# Patient Record
Sex: Female | Born: 1970 | Race: Black or African American | Hispanic: No | Marital: Single | Smoking: Never smoker
Health system: Southern US, Community
[De-identification: ages and names within clinical notes are randomized; demographics above are authoritative.]

## PROBLEM LIST (undated history)

## (undated) DIAGNOSIS — F329 Major depressive disorder, single episode, unspecified: Secondary | ICD-10-CM

## (undated) DIAGNOSIS — R519 Headache, unspecified: Secondary | ICD-10-CM

## (undated) DIAGNOSIS — R51 Headache: Secondary | ICD-10-CM

## (undated) DIAGNOSIS — R45851 Suicidal ideations: Secondary | ICD-10-CM

## (undated) DIAGNOSIS — T1491XA Suicide attempt, initial encounter: Secondary | ICD-10-CM

## (undated) DIAGNOSIS — I8390 Asymptomatic varicose veins of unspecified lower extremity: Secondary | ICD-10-CM

## (undated) DIAGNOSIS — F410 Panic disorder [episodic paroxysmal anxiety] without agoraphobia: Secondary | ICD-10-CM

## (undated) DIAGNOSIS — I10 Essential (primary) hypertension: Secondary | ICD-10-CM

## (undated) DIAGNOSIS — T7422XA Child sexual abuse, confirmed, initial encounter: Secondary | ICD-10-CM

## (undated) DIAGNOSIS — F32A Depression, unspecified: Secondary | ICD-10-CM

## (undated) DIAGNOSIS — T7840XA Allergy, unspecified, initial encounter: Secondary | ICD-10-CM

## (undated) DIAGNOSIS — F419 Anxiety disorder, unspecified: Secondary | ICD-10-CM

## (undated) HISTORY — DX: Suicidal ideations: R45.851

## (undated) HISTORY — PX: WISDOM TOOTH EXTRACTION: SHX21

## (undated) HISTORY — DX: Child sexual abuse, confirmed, initial encounter: T74.22XA

## (undated) HISTORY — DX: Allergy, unspecified, initial encounter: T78.40XA

## (undated) HISTORY — PX: COSMETIC SURGERY: SHX468

## (undated) HISTORY — DX: Anxiety disorder, unspecified: F41.9

## (undated) HISTORY — DX: Panic disorder (episodic paroxysmal anxiety): F41.0

---

## 1999-05-28 HISTORY — PX: TUBAL LIGATION: SHX77

## 1999-05-28 HISTORY — PX: REDUCTION MAMMAPLASTY: SUR839

## 2000-01-24 ENCOUNTER — Other Ambulatory Visit: Admission: RE | Admit: 2000-01-24 | Discharge: 2000-01-24 | Payer: Self-pay | Admitting: Plastic Surgery

## 2000-01-24 ENCOUNTER — Encounter (INDEPENDENT_AMBULATORY_CARE_PROVIDER_SITE_OTHER): Payer: Self-pay

## 2000-04-26 ENCOUNTER — Inpatient Hospital Stay (HOSPITAL_COMMUNITY): Admission: AD | Admit: 2000-04-26 | Discharge: 2000-04-26 | Payer: Self-pay | Admitting: Obstetrics

## 2000-09-30 ENCOUNTER — Encounter: Payer: Self-pay | Admitting: Emergency Medicine

## 2000-09-30 ENCOUNTER — Emergency Department (HOSPITAL_COMMUNITY): Admission: EM | Admit: 2000-09-30 | Discharge: 2000-09-30 | Payer: Self-pay | Admitting: Emergency Medicine

## 2001-08-13 ENCOUNTER — Other Ambulatory Visit: Admission: RE | Admit: 2001-08-13 | Discharge: 2001-08-13 | Payer: Self-pay | Admitting: *Deleted

## 2001-10-16 ENCOUNTER — Emergency Department (HOSPITAL_COMMUNITY): Admission: EM | Admit: 2001-10-16 | Discharge: 2001-10-16 | Payer: Self-pay | Admitting: Emergency Medicine

## 2001-12-22 ENCOUNTER — Ambulatory Visit (HOSPITAL_COMMUNITY): Admission: RE | Admit: 2001-12-22 | Discharge: 2001-12-22 | Payer: Self-pay | Admitting: *Deleted

## 2002-02-10 ENCOUNTER — Encounter: Payer: Self-pay | Admitting: Emergency Medicine

## 2002-02-10 ENCOUNTER — Emergency Department (HOSPITAL_COMMUNITY): Admission: EM | Admit: 2002-02-10 | Discharge: 2002-02-10 | Payer: Self-pay | Admitting: Emergency Medicine

## 2002-03-28 ENCOUNTER — Emergency Department (HOSPITAL_COMMUNITY): Admission: EM | Admit: 2002-03-28 | Discharge: 2002-03-28 | Payer: Self-pay | Admitting: Emergency Medicine

## 2003-09-29 ENCOUNTER — Emergency Department (HOSPITAL_COMMUNITY): Admission: EM | Admit: 2003-09-29 | Discharge: 2003-09-29 | Payer: Self-pay | Admitting: Emergency Medicine

## 2004-02-25 ENCOUNTER — Emergency Department (HOSPITAL_COMMUNITY): Admission: EM | Admit: 2004-02-25 | Discharge: 2004-02-25 | Payer: Self-pay | Admitting: Emergency Medicine

## 2004-08-07 ENCOUNTER — Emergency Department (HOSPITAL_COMMUNITY): Admission: EM | Admit: 2004-08-07 | Discharge: 2004-08-07 | Payer: Self-pay | Admitting: Family Medicine

## 2004-09-05 ENCOUNTER — Emergency Department (HOSPITAL_COMMUNITY): Admission: EM | Admit: 2004-09-05 | Discharge: 2004-09-05 | Payer: Self-pay | Admitting: Family Medicine

## 2005-03-15 ENCOUNTER — Emergency Department (HOSPITAL_COMMUNITY): Admission: EM | Admit: 2005-03-15 | Discharge: 2005-03-15 | Payer: Self-pay | Admitting: Emergency Medicine

## 2005-08-07 ENCOUNTER — Inpatient Hospital Stay (HOSPITAL_COMMUNITY): Admission: AD | Admit: 2005-08-07 | Discharge: 2005-08-07 | Payer: Self-pay | Admitting: Obstetrics

## 2006-10-02 ENCOUNTER — Emergency Department (HOSPITAL_COMMUNITY): Admission: EM | Admit: 2006-10-02 | Discharge: 2006-10-02 | Payer: Self-pay | Admitting: Emergency Medicine

## 2007-01-29 ENCOUNTER — Emergency Department (HOSPITAL_COMMUNITY): Admission: EM | Admit: 2007-01-29 | Discharge: 2007-01-29 | Payer: Self-pay | Admitting: Emergency Medicine

## 2007-12-08 ENCOUNTER — Emergency Department (HOSPITAL_COMMUNITY): Admission: EM | Admit: 2007-12-08 | Discharge: 2007-12-08 | Payer: Self-pay | Admitting: Family Medicine

## 2009-02-09 ENCOUNTER — Ambulatory Visit (HOSPITAL_COMMUNITY): Admission: RE | Admit: 2009-02-09 | Discharge: 2009-02-09 | Payer: Self-pay | Admitting: Obstetrics

## 2009-03-01 ENCOUNTER — Encounter (INDEPENDENT_AMBULATORY_CARE_PROVIDER_SITE_OTHER): Payer: Self-pay | Admitting: Obstetrics

## 2009-03-01 ENCOUNTER — Ambulatory Visit (HOSPITAL_COMMUNITY): Admission: RE | Admit: 2009-03-01 | Discharge: 2009-03-01 | Payer: Self-pay | Admitting: Obstetrics

## 2009-05-27 HISTORY — PX: DILATION AND CURETTAGE OF UTERUS: SHX78

## 2009-05-27 HISTORY — PX: ABDOMINOPLASTY: SUR9

## 2010-06-08 ENCOUNTER — Emergency Department (HOSPITAL_COMMUNITY)
Admission: EM | Admit: 2010-06-08 | Discharge: 2010-06-08 | Payer: Self-pay | Source: Home / Self Care | Admitting: Family Medicine

## 2010-08-30 LAB — CBC
HCT: 37.4 % (ref 36.0–46.0)
Hemoglobin: 12 g/dL (ref 12.0–15.0)
MCHC: 32.2 g/dL (ref 30.0–36.0)
MCV: 83.8 fL (ref 78.0–100.0)
Platelets: 317 10*3/uL (ref 150–400)
RBC: 4.46 MIL/uL (ref 3.87–5.11)
RDW: 14 % (ref 11.5–15.5)
WBC: 11.2 10*3/uL — ABNORMAL HIGH (ref 4.0–10.5)

## 2010-08-30 LAB — PREGNANCY, URINE: Preg Test, Ur: NEGATIVE

## 2011-07-25 ENCOUNTER — Emergency Department (HOSPITAL_COMMUNITY): Payer: Self-pay

## 2011-07-25 ENCOUNTER — Emergency Department (HOSPITAL_COMMUNITY)
Admission: EM | Admit: 2011-07-25 | Discharge: 2011-07-25 | Disposition: A | Discharge status: Home / Self Care | Payer: Self-pay | Attending: Emergency Medicine | Admitting: Emergency Medicine

## 2011-07-25 DIAGNOSIS — M25529 Pain in unspecified elbow: Secondary | ICD-10-CM | POA: Insufficient documentation

## 2011-07-25 DIAGNOSIS — W010XXA Fall on same level from slipping, tripping and stumbling without subsequent striking against object, initial encounter: Secondary | ICD-10-CM | POA: Insufficient documentation

## 2011-07-25 DIAGNOSIS — M25531 Pain in right wrist: Secondary | ICD-10-CM

## 2011-07-25 DIAGNOSIS — M25539 Pain in unspecified wrist: Secondary | ICD-10-CM | POA: Insufficient documentation

## 2011-07-25 MED ORDER — IBUPROFEN 800 MG PO TABS: 800.0000 mg | ORAL_TABLET | Freq: Three times a day (TID) | ORAL | Status: AC | PRN

## 2011-07-25 NOTE — Progress Notes (Signed)
Orthopedic Tech Progress Note Patient Details:  Jane Snyder 06/26/1970 [id number]  Other Ortho Devices Type of Ortho Device: Other (comment) (velcro wrist splint) Ortho Device Location: (R) UE Ortho Device Interventions: Application   Jennye Moccasin 07/25/2011, 5:22 PM

## 2011-07-25 NOTE — ED Notes (Addendum)
Pt here after slipping and catching self with her L wrist on sat. Swelling noted. Pt with a velcro splint she placed pta.swelling noted to wrist and fingers.

## 2011-07-25 NOTE — Discharge Instructions (Signed)
Wrist Pain Wrist injuries are frequent in adults and children. A sprain is an injury to the ligaments that hold your bones together. A strain is an injury to muscle or muscle cord-like structures (tendons) from stretching or pulling. Generally, when wrists are moderately tender to touch following a fall or injury, a break in the bone (fracture) may be present. Most wrist sprains or strains are better in 3 to 5 days, but complete healing may take several weeks. HOME CARE INSTRUCTIONS   Put ice on the injured area.   Put ice in a plastic bag.   Place a towel between your skin and the bag.   Leave the ice on for 15 to 20 minutes, 3 to 4 times a day, for the first 2 days.   Keep your arm raised above the level of your heart whenever possible to reduce swelling and pain.   Rest the injured area for at least 48 hours or as directed by your caregiver.   If a splint or elastic bandage has been applied, use it for as long as directed by your caregiver or until seen by a caregiver for a follow-up exam.   Only take over-the-counter or prescription medicines for pain, discomfort, or fever as directed by your caregiver.   Keep all follow-up appointments. You may need to follow up with a specialist or have follow-up X-rays. Improvement in pain level is not a guarantee that you did not fracture a bone in your wrist. The only way to determine whether or not you have a broken bone is by X-ray.  SEEK IMMEDIATE MEDICAL CARE IF:   Your fingers are swollen, very red, white, or cold and blue.   Your fingers are numb or tingling.   You have increasing pain.   You have difficulty moving your fingers.  MAKE SURE YOU:   Understand these instructions.   Will watch your condition.   Will get help right away if you are not doing well or get worse.  Document Released: 02/20/2005 Document Revised: 01/23/2011 Document Reviewed: 07/04/2010 [employer] Patient Information 2012 [employer]. 

## 2011-07-25 NOTE — ED Provider Notes (Signed)
History     CSN: [id number]  Arrival date & time 07/25/11  1443   First MD Initiated Contact with Patient 07/25/11 1601      Chief Complaint  Patient presents with  . Wrist Pain    (Consider location/radiation/quality/duration/timing/severity/associated sxs/prior treatment) HPI Comments: The patient presents with right wrist pain x5 days. The pain started when she was trying to get into a hot tub 5 days ago, slipped, and caught herself with her right hand. She is not sure which part of the hand endured the most impact. She has been experiencing progressive pain for the past 5 days that she reports as throbbing and sometimes radiates to her right elbow. 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 She has not tried anything for the pain. She denies numbness/tingling distal to the right wrist, weakness, color change of her right hand, or coolness of her right hand.   Patient is a 41 y.o. female presenting with wrist pain. The history is provided by the patient.  Wrist Pain Pertinent negatives include no fever, numbness or weakness.    No past medical history on file.  No past surgical history on file.  No family history on file.  History  Substance Use Topics  . Smoking status: Not on file  . Smokeless tobacco: Not on file  . Alcohol Use: Not on file    OB History    No data available      Review of Systems  Constitutional: Negative for fever.  Neurological: Negative for weakness and numbness.  All other systems reviewed and are negative.    Allergies  Review of patient's allergies indicates no known allergies.  Home Medications  No current outpatient prescriptions on file.  BP 150/75  Pulse 64  Temp 98.8 F (37.1 C)  Resp 16  SpO2 99%  LMP 07/01/2011  Physical Exam  Nursing note and vitals reviewed. Constitutional: She is oriented to person, place, and time. She appears well-developed and  well-nourished.  HENT:  Head: Normocephalic and atraumatic.  Neck: Neck supple.  Cardiovascular: Normal rate and regular rhythm.   Pulmonary/Chest: Effort normal and breath sounds normal.  Musculoskeletal:       Right elbow: Normal.She exhibits normal range of motion and no swelling. no tenderness found.       Right wrist: She exhibits decreased range of motion, tenderness and swelling. She exhibits no crepitus, no deformity and no laceration.       Right hand: Normal.       Right wrist tender to palpation diffusely. Pain exacerbated by supination.  Strength 5/5, sensation intact, fingers with full AROM, capillary refill < 2 seconds.  Radial pulse intact.      Neurological: She is alert and oriented to person, place, and time.    ED Course  Procedures (including critical care time)  Labs Reviewed - No data to display Dg Wrist Complete Right  07/25/2011  *RADIOLOGY REPORT*  Clinical Data: Fall with wrist pain.  RIGHT WRIST - COMPLETE 3+ VIEW  Comparison: 01/29/2007.  Findings: No acute osseous or joint abnormality.  IMPRESSION: No acute osseous or joint abnormality.  Original Report Authenticated By: Reyes Ivan, M.D.     1. Pain in right wrist       MDM  Patient with pain x 5 days after falling short distance onto wrist.  Xray is negative.  Pt placed in velcro splint in ED, given RICE instructions, PCP  follow up.  Patient verbalizes understanding and agrees with plan.          Dillard Cannon [place], [place] 07/25/11 2132

## 2011-07-25 NOTE — ED Notes (Signed)
Was on cruise and hurt rt wrist will not stop hurting

## 2011-07-25 NOTE — ED Provider Notes (Signed)
Medical screening examination/treatment/procedure(s) were performed by non-physician practitioner and as supervising physician I was immediately available for consultation/collaboration.   Glynn Octave, MD 07/25/11 2156

## 2011-08-26 ENCOUNTER — Encounter (HOSPITAL_COMMUNITY): Payer: Self-pay | Admitting: *Deleted

## 2011-08-26 ENCOUNTER — Emergency Department (HOSPITAL_COMMUNITY)
Admission: EM | Admit: 2011-08-26 | Discharge: 2011-08-27 | Disposition: A | Discharge status: Home / Self Care | Payer: Self-pay | Attending: Emergency Medicine | Admitting: Emergency Medicine

## 2011-08-26 DIAGNOSIS — R000 Tachycardia, unspecified: Secondary | ICD-10-CM | POA: Insufficient documentation

## 2011-08-26 DIAGNOSIS — R0682 Tachypnea, not elsewhere classified: Secondary | ICD-10-CM | POA: Insufficient documentation

## 2011-08-26 DIAGNOSIS — F418 Other specified anxiety disorders: Secondary | ICD-10-CM

## 2011-08-26 DIAGNOSIS — J45909 Unspecified asthma, uncomplicated: Secondary | ICD-10-CM | POA: Insufficient documentation

## 2011-08-26 DIAGNOSIS — F411 Generalized anxiety disorder: Secondary | ICD-10-CM | POA: Insufficient documentation

## 2011-08-26 MED ORDER — LORAZEPAM 1 MG PO TABS
1.0000 mg | ORAL_TABLET | Freq: Once | ORAL | Status: AC
  Administered 2011-08-26: 1 mg via ORAL
  Filled 2011-08-26: qty 1

## 2011-08-26 MED ORDER — LORAZEPAM 1 MG PO TABS: 0.5000 mg | ORAL_TABLET | Freq: Four times a day (QID) | ORAL | Status: AC | PRN

## 2011-08-26 NOTE — ED Provider Notes (Signed)
Medical screening examination/treatment/procedure(s) were performed by non-physician practitioner and as supervising physician I was immediately available for consultation/collaboration.   Celene Kras, MD 08/26/11 [phone]

## 2011-08-26 NOTE — ED Notes (Signed)
Per EMS:  Tonight pt was given bad news and started having a panic attack and pt became SOB and used her inhaler.  Pt's family called EMS because pt was having a panic attack.  Upon EMS' arrival pt did not appear to be in any distress.  Pt was given 5mg  of albuterol in route, pt is now clear and equal bilaterally and in no acute distress.

## 2011-08-26 NOTE — Discharge Instructions (Signed)
RESOURCE GUIDE  Dental Problems  Patients with Medicaid: [facility]                     [facility] [address]                                           [address] Phone:  [phone]                                                  Phone:  [phone]  If unable to pay or uninsured, contact:  [facility] or [facility] to become qualified for the adult dental clinic.  Chronic Pain Problems Contact [facility]  [phone] Patients need to be referred by their primary care doctor.  Insufficient Money for Medicine Contact [employer]:  call "211" or [facility] 271-5999.  No Primary Care Doctor Call [facility]  832-8000 Other agencies that provide inexpensive medical care    [facility]  [phone]    [facility]  [phone]    [facility]  [phone]    Women's Clinic  [phone]    [facility]  [phone]    [facility]  [phone]  Psychological Services [facility]  [phone] [facility]  [phone] [facility]   800 853-5163 (emergency services 641-4993)  Substance Abuse Resources [facility]  336-882-2125 [facility] 336-784-9470 [facility] 336-285-9073 [facility] 336-845-3988 Residential & Outpatient Substance Abuse Program  800-659-3381  Abuse/Neglect [place] Child Abuse Hotline (336) 641-3795 [place] Child Abuse Hotline 800-378-5315 (After Hours)  Emergency Shelter [facility] (336) 271-5985  Maternity Homes [facility] (336) 275-9566 [facility] (704) 372-4663  MRSA Hotline #:   832-7006    [place] Resources  [facility] [place]     [employer]                          [facility] [address] [place]                       [address]      [address]  [place]                                                [place]                            [place] Phone:  [phone]                                   Phone:  [phone]                 Phone:  [phone]  [facility] Phone:  [phone]    [facility] Child Abuse Hotline (248) 879-5723 (903)480-9057 (After Hours) Anxiety and Panic Attacks Anxiety is your body's way of reacting to real danger or something you think is a danger. It may be fear or worry over a situation like losing your job. Sometimes the cause is not known. A panic attack is made up of physical signs like sweating, shaking, or chest pain. Anxiety and panic attacks may start suddenly. They may be strong. They may come at any time of day, even while sleeping. They may come at any time of life. Panic attacks are scary, but they do not harm you physically.  HOME CARE  Avoid any known causes of your anxiety.   Try to relax. Yoga may help. Tell yourself everything will be okay.   Exercise often.   Get expert advice and help (therapy) to stop anxiety or attacks from happening.   Avoid caffeine, alcohol, and drugs.   Only take medicine as told by your doctor.  GET HELP RIGHT AWAY IF:  Your attacks seem different than normal attacks.   Your problems are getting worse or concern you.  MAKE SURE YOU:  Understand these instructions.   Will watch your condition.   Will get help right away if you are not doing well or get worse.  Document Released: 06/15/2010 Document Revised: 05/02/2011 Document Reviewed: 06/15/2010 [facility] Patient Information 2012 [place], [place]. Please make an appointment with the counsel of your choice for evaluation and, discussion.  You've been given a short course of Ativan to help bridge the gap between now and therapy

## 2011-08-26 NOTE — ED Notes (Signed)
Pt st's "I was given some disturbing news about one of my children."  Pt had panic attack at home per family and is very upset.  St's she was having some trouble breathing but EMS' gave 5mg  of albuterol in route and now pt is clear and equal bilaterally.

## 2011-08-26 NOTE — ED Provider Notes (Signed)
History     CSN: [id number]  Arrival date & time 08/26/11  2124   First MD Initiated Contact with Patient 08/26/11 2144      Chief Complaint  Patient presents with  . Anxiety    (Consider location/radiation/quality/duration/timing/severity/associated sxs/prior treatment) HPI Comments: Patient found out today that her 39 year old daughter, who is a Holiday representative in high school is pregnant.  This set off her asthma and an anxiety attack.  Patient does not have a history of anxiety attacks.  She came to the emergency department via EMS, crying, unable to catch her breath, tachycardic and tachypneic.  She has family at the bedside  Patient is a 41 y.o. female presenting with anxiety. The history is provided by the patient and a relative.  Anxiety This is a new problem. The current episode started today. The problem occurs constantly. Pertinent negatives include no chills, congestion, fever, myalgias, nausea, numbness, vomiting or weakness. The symptoms are aggravated by stress. She has tried nothing for the symptoms.    Past Medical History  Diagnosis Date  . Asthma     History reviewed. No pertinent past surgical history.  No family history on file.  History  Substance Use Topics  . Smoking status: Not on file  . Smokeless tobacco: Not on file  . Alcohol Use:     OB History    Grav Para Term Preterm Abortions TAB SAB Ect Mult Living                  Review of Systems  Constitutional: Negative for fever and chills.  HENT: Negative for congestion.   Gastrointestinal: Negative for nausea and vomiting.  Musculoskeletal: Negative for myalgias.  Neurological: Negative for dizziness, weakness and numbness.  Psychiatric/Behavioral: Positive for agitation.    Allergies  Review of patient's allergies indicates no known allergies.  Home Medications   Current Outpatient Rx  Name Route Sig Dispense Refill  . ALBUTEROL SULFATE HFA 108 (90 BASE) MCG/ACT IN AERS Inhalation Inhale 2  puffs into the lungs every 6 (six) hours as needed.      BP 168/100  Pulse 82  Temp(Src) 98.8 F (37.1 C) (Oral)  Resp 24  SpO2 100%  Physical Exam  Constitutional: She is oriented to person, place, and time. She appears well-developed and well-nourished.  HENT:  Head: Normocephalic.  Neck: Normal range of motion.  Cardiovascular: Normal rate.   Pulmonary/Chest: Effort normal.  Musculoskeletal: Normal range of motion.  Neurological: She is alert and oriented to person, place, and time.  Skin: Skin is warm.    ED Course  Procedures (including critical care time)  Labs Reviewed - No data to display No results found.   1. Situational anxiety     Patient received by mouth Ativan.  She was able to calm herself to the point where she can discuss her situation.  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  Her mother and sister at the bedside.  She feels safe going home.  I have offered a short course of Ativan to help her with this acute situational anxiety  MDM  Situational anxiety        Arman Filter, NP 08/26/11 2319  Arman Filter, NP 08/26/11 [phone]

## 2012-08-18 ENCOUNTER — Encounter (HOSPITAL_COMMUNITY): Payer: Self-pay | Admitting: *Deleted

## 2012-08-18 ENCOUNTER — Inpatient Hospital Stay (HOSPITAL_COMMUNITY)
Admission: AD | Admit: 2012-08-18 | Discharge: 2012-08-24 | DRG: 885 | Disposition: A | Discharge status: Home / Self Care | Payer: Federal, State, Local not specified - Other | Source: Intra-hospital | Attending: Psychiatry | Admitting: Psychiatry

## 2012-08-18 ENCOUNTER — Emergency Department (HOSPITAL_COMMUNITY)
Admission: EM | Admit: 2012-08-18 | Discharge: 2012-08-18 | Disposition: A | Discharge status: Other Acute Inpatient Hospital | Payer: Self-pay | Attending: Emergency Medicine | Admitting: Emergency Medicine

## 2012-08-18 DIAGNOSIS — R51 Headache: Secondary | ICD-10-CM

## 2012-08-18 DIAGNOSIS — Z3202 Encounter for pregnancy test, result negative: Secondary | ICD-10-CM | POA: Insufficient documentation

## 2012-08-18 DIAGNOSIS — F411 Generalized anxiety disorder: Secondary | ICD-10-CM | POA: Diagnosis present

## 2012-08-18 DIAGNOSIS — R45851 Suicidal ideations: Secondary | ICD-10-CM

## 2012-08-18 DIAGNOSIS — J45909 Unspecified asthma, uncomplicated: Secondary | ICD-10-CM | POA: Insufficient documentation

## 2012-08-18 DIAGNOSIS — F329 Major depressive disorder, single episode, unspecified: Secondary | ICD-10-CM

## 2012-08-18 DIAGNOSIS — G43909 Migraine, unspecified, not intractable, without status migrainosus: Secondary | ICD-10-CM | POA: Diagnosis present

## 2012-08-18 DIAGNOSIS — Z79899 Other long term (current) drug therapy: Secondary | ICD-10-CM | POA: Insufficient documentation

## 2012-08-18 DIAGNOSIS — F332 Major depressive disorder, recurrent severe without psychotic features: Principal | ICD-10-CM | POA: Diagnosis present

## 2012-08-18 HISTORY — DX: Major depressive disorder, single episode, unspecified: F32.9

## 2012-08-18 HISTORY — DX: Suicide attempt, initial encounter: T14.91XA

## 2012-08-18 HISTORY — DX: Depression, unspecified: F32.A

## 2012-08-18 LAB — COMPREHENSIVE METABOLIC PANEL
AST: 13 U/L (ref 0–37)
Albumin: 3.1 g/dL — ABNORMAL LOW (ref 3.5–5.2)
Alkaline Phosphatase: 86 U/L (ref 39–117)
Chloride: 100 mEq/L (ref 96–112)
Creatinine, Ser: 0.75 mg/dL (ref 0.50–1.10)
Potassium: 3.7 mEq/L (ref 3.5–5.1)
Total Bilirubin: 0.3 mg/dL (ref 0.3–1.2)

## 2012-08-18 LAB — CBC
Hemoglobin: 11.9 g/dL — ABNORMAL LOW (ref 12.0–15.0)
MCH: 27 pg (ref 26.0–34.0)
MCHC: 26.4 g/dL — ABNORMAL LOW (ref 30.0–36.0)
MCV: 102 fL — ABNORMAL HIGH (ref 78.0–100.0)
Platelets: 274 10*3/uL (ref 150–400)
RDW: 14.9 % (ref 11.5–15.5)
WBC: 10.2 10*3/uL (ref 4.0–10.5)

## 2012-08-18 LAB — POCT PREGNANCY, URINE: Preg Test, Ur: NEGATIVE

## 2012-08-18 LAB — RAPID URINE DRUG SCREEN, HOSP PERFORMED
Amphetamines: NOT DETECTED
Opiates: NOT DETECTED
Tetrahydrocannabinol: NOT DETECTED

## 2012-08-18 MED ORDER — TRAZODONE HCL 50 MG PO TABS
50.0000 mg | ORAL_TABLET | Freq: Every day | ORAL | Status: DC
  Administered 2012-08-18: 50 mg via ORAL
  Filled 2012-08-18 (×4): qty 1

## 2012-08-18 MED ORDER — AMLODIPINE BESYLATE 5 MG PO TABS: 5.0000 mg | ORAL_TABLET | Freq: Every day | ORAL | Status: DC

## 2012-08-18 MED ORDER — AMLODIPINE BESYLATE 5 MG PO TABS
5.0000 mg | ORAL_TABLET | Freq: Every day | ORAL | Status: AC
  Administered 2012-08-18: 5 mg via ORAL
  Filled 2012-08-18: qty 1

## 2012-08-18 MED ORDER — ALUM & MAG HYDROXIDE-SIMETH 200-200-20 MG/5ML PO SUSP
30.0000 mL | ORAL | Status: DC | PRN
  Administered 2012-08-23: 30 mL via ORAL

## 2012-08-18 MED ORDER — MAGNESIUM HYDROXIDE 400 MG/5ML PO SUSP: 30.0000 mL | Freq: Every day | ORAL | Status: DC | PRN

## 2012-08-18 MED ORDER — ACETAMINOPHEN 325 MG PO TABS: 650.0000 mg | ORAL_TABLET | Freq: Four times a day (QID) | ORAL | Status: DC | PRN

## 2012-08-18 MED ORDER — ALBUTEROL SULFATE HFA 108 (90 BASE) MCG/ACT IN AERS: 2.0000 | INHALATION_SPRAY | Freq: Four times a day (QID) | RESPIRATORY_TRACT | Status: DC | PRN

## 2012-08-18 MED ORDER — LISINOPRIL 10 MG PO TABS
10.0000 mg | ORAL_TABLET | Freq: Once | ORAL | Status: AC
  Administered 2012-08-18: 10 mg via ORAL
  Filled 2012-08-18 (×2): qty 1

## 2012-08-18 MED ORDER — AMLODIPINE BESYLATE 5 MG PO TABS
5.0000 mg | ORAL_TABLET | Freq: Every day | ORAL | Status: DC
  Administered 2012-08-19 – 2012-08-24 (×6): 5 mg via ORAL
  Filled 2012-08-18 (×9): qty 1

## 2012-08-18 MED ORDER — MAGNESIUM HYDROXIDE 400 MG/5ML PO SUSP
30.0000 mL | Freq: Every day | ORAL | Status: DC | PRN
  Administered 2012-08-21: 30 mL via ORAL

## 2012-08-18 MED ORDER — ACETAMINOPHEN 325 MG PO TABS
650.0000 mg | ORAL_TABLET | Freq: Four times a day (QID) | ORAL | Status: DC | PRN
  Administered 2012-08-18 – 2012-08-19 (×3): 650 mg via ORAL

## 2012-08-18 MED ORDER — ALUM & MAG HYDROXIDE-SIMETH 200-200-20 MG/5ML PO SUSP: 30.0000 mL | ORAL | Status: DC | PRN

## 2012-08-18 NOTE — Tx Team (Signed)
Initial Interdisciplinary Treatment Plan  PATIENT STRENGTHS: (choose at least two) Ability for insight Average or above average intelligence Capable of independent living Physical Health  PATIENT STRESSORS: Financial difficulties   PROBLEM LIST: Problem List/Patient Goals Date to be addressed Date deferred Reason deferred Estimated date of resolution  Suicidal thoughts 08/18/2012     Depression 08/18/2012                                                DISCHARGE CRITERIA:  Ability to meet basic life and health needs Need for constant or close observation no longer present  PRELIMINARY DISCHARGE PLAN: Return to previous work or school arrangements  PATIENT/FAMIILY INVOLVEMENT: This treatment plan has been presented to and reviewed with the patient, Jane Snyder.  The patient  has been given the opportunity to ask questions and make suggestions.  Leighton Parody M 08/18/2012, 4:17 PM

## 2012-08-18 NOTE — ED Provider Notes (Signed)
Medical screening examination/treatment/procedure(s) were performed by non-physician practitioner and as supervising physician I was immediately available for consultation/collaboration.  Kelsi Benham, MD 08/18/12 1556 

## 2012-08-18 NOTE — ED Notes (Signed)
Pt now being transported to [facility].

## 2012-08-18 NOTE — ED Notes (Signed)
Pt brought from [facility] accompanied by [employer] with IVC papers for medical clearance. Pt reports SI with a plan of driving car into a tree or off a bridge. Pt denies HI or AV hallucinations. PA at bedside at 1209.

## 2012-08-18 NOTE — ED Notes (Signed)
Security in to wand patient and search pt belongings. 

## 2012-08-18 NOTE — Progress Notes (Signed)
Pt reports she was admitted today after being petitioned by her sister.  She recently lost her job and is the single mother of three teenagers.  She was having suicidal thoughts, but can contract for safety here.  She says she feels safe here.  She was observed in her room sitting on the bed, but plans to go to evening group tonight.  Her BP was elevated earlier, but she was given a one time dose of Lisinopril which brought it down some.  Encouraged pt to make her needs known to staff.  Support/encouragement given.  Safety maintained with q15 minute checks.

## 2012-08-18 NOTE — ED Notes (Signed)
Awaiting MD to complete EMTALA form, will monitor.

## 2012-08-18 NOTE — Treatment Plan (Signed)
Pt accepted to [facility] by Nanine Means, NP to room 303-1.

## 2012-08-18 NOTE — ED Notes (Signed)
Called [facility], spoke with Minerva Areola and was told that pt does have a bed pending medical clearance, PA also informed, will monitor.
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
## 2012-08-18 NOTE — ED Notes (Signed)
Tanna Savoy at [facility] called by Minerva Areola, RN in Psych ED and was told that pt does have a bed, 303, bed 1, PA aware, will monitor.

## 2012-08-18 NOTE — ED Notes (Signed)
Pt transferred to [facility] accompanied by [employer] with IVC, chart and personal belongings. Condition stable at time of discharge.

## 2012-08-18 NOTE — ED Notes (Signed)
Spoke with Minerva Areola at [facility] again after pt medically cleared per PA at [facility] ED, this nurse informed that pt does not have a bed, Josh, PA informed for holding orders for Psych ED.

## 2012-08-18 NOTE — ED Provider Notes (Signed)
History     CSN: [id number]  Arrival date & time 08/18/12  1134   First MD Initiated Contact with Patient 08/18/12 1033      Chief Complaint  Patient presents with  . Medical Clearance    (Consider location/radiation/quality/duration/timing/severity/associated sxs/prior treatment) HPI Comments: Patient with history of depression presents under involuntary commitment for suicidal ideation. Patient states that she has multiple stressors at home. She has a past history of suicide attempt by overdose. She denies drug or alcohol use. She denies any other current medical complaints. Onset of symptoms insidious. Course is constant. Nothing makes symptoms better/worse.  The history is provided by the patient and medical records.    Past Medical History  Diagnosis Date  . Asthma     Past Surgical History  Procedure Laterality Date  . Breast surgery    . Tubal ligation    . Cosmetic surgery      Abdomen  . Wisdom tooth extraction      History reviewed. No pertinent family history.  History  Substance Use Topics  . Smoking status: Not on file  . Smokeless tobacco: Not on file  . Alcohol Use:     OB History   Grav Para Term Preterm Abortions TAB SAB Ect Mult Living                  Review of Systems  Constitutional: Negative for fever.  HENT: Negative for sore throat and rhinorrhea.   Eyes: Negative for redness.  Respiratory: Negative for cough.   Cardiovascular: Negative for chest pain.  Gastrointestinal: Negative for nausea, vomiting, abdominal pain and diarrhea.  Genitourinary: Negative for dysuria.  Musculoskeletal: Negative for myalgias.  Skin: Negative for rash.  Neurological: Negative for headaches.  Psychiatric/Behavioral: Positive for suicidal ideas. Negative for self-injury.    Allergies  Review of patient's allergies indicates no known allergies.  Home Medications   Current Outpatient Rx  Name  Route  Sig  Dispense  Refill  . albuterol (PROVENTIL  HFA;VENTOLIN HFA) 108 (90 BASE) MCG/ACT inhaler   Inhalation   Inhale 2 puffs into the lungs every 6 (six) hours as needed.           BP 181/109  Pulse 70  Temp(Src) 98.6 F (37 C) (Oral)  Resp 17  Wt 203 lb (92.08 kg)  SpO2 99%  Physical Exam  Nursing note and vitals reviewed. Constitutional: She appears well-developed and well-nourished.  HENT:  Head: Normocephalic and atraumatic.  Eyes: Conjunctivae are normal. Right eye exhibits no discharge. Left eye exhibits no discharge.  Neck: Normal range of motion. Neck supple.  Cardiovascular: Normal rate, regular rhythm and normal heart sounds.   Pulmonary/Chest: Effort normal and breath sounds normal.  Abdominal: Soft. There is no tenderness.  Neurological: She is alert.  Skin: Skin is warm and dry.  Psychiatric: She exhibits a depressed mood. She expresses suicidal ideation. She expresses no homicidal ideation.    ED Course  Procedures (including critical care time)  Labs Reviewed  CBC - Abnormal; Notable for the following:    Hemoglobin 11.9 (*)    MCV 102.0 (*)    MCHC 26.4 (*)    All other components within normal limits  COMPREHENSIVE METABOLIC PANEL - Abnormal; Notable for the following:    Sodium 134 (*)    Albumin 3.1 (*)    All other components within normal limits  ETHANOL  URINE RAPID DRUG SCREEN (HOSP PERFORMED)  POCT PREGNANCY, URINE   No results found.  1. Suicidal ideation     12:12 PM Patient seen and examined. Work-up initiated. Medications ordered.   Vital signs reviewed and are as follows: Filed Vitals:   08/18/12 1135  BP: 181/109  Pulse: 70  Temp: 98.6 F (37 C)  Resp: 17   3:14 PM Patient was medically cleared. Blood pressure improved without treatment. Patient will likely need this followed as an outpatient. No evidence of endorgan damage today.  Patient has a bed awaiting her at [facility]. She was transferred to [facility].   MDM  Patient with SI under IVC. Transferred to behavioral  health. She is medically cleared.        Renne Crigler, PA-C 08/18/12 1515

## 2012-08-19 MED ORDER — IBUPROFEN 600 MG PO TABS
600.0000 mg | ORAL_TABLET | Freq: Four times a day (QID) | ORAL | Status: DC | PRN
  Administered 2012-08-19 – 2012-08-23 (×6): 600 mg via ORAL
  Filled 2012-08-19 (×6): qty 1

## 2012-08-19 MED ORDER — TRAZODONE HCL 100 MG PO TABS
100.0000 mg | ORAL_TABLET | Freq: Every evening | ORAL | Status: DC | PRN
  Filled 2012-08-19 (×3): qty 1

## 2012-08-19 MED ORDER — SERTRALINE HCL 25 MG PO TABS
25.0000 mg | ORAL_TABLET | Freq: Every day | ORAL | Status: DC
  Administered 2012-08-19 – 2012-08-21 (×3): 25 mg via ORAL
  Filled 2012-08-19 (×5): qty 1

## 2012-08-19 MED ORDER — HYDROXYZINE HCL 50 MG PO TABS
50.0000 mg | ORAL_TABLET | Freq: Every evening | ORAL | Status: DC | PRN
  Administered 2012-08-19 – 2012-08-20 (×2): 50 mg via ORAL
  Filled 2012-08-19 (×8): qty 1
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
## 2012-08-19 NOTE — Progress Notes (Signed)
[facility] LCSW Group Therapy  Emotional Regulation  08/19/2012 3:54 PM  Type of Therapy:  Group Therapy  Participation Level:  Did Not Attend  Wynn Banker 08/19/2012, 3:54 PM

## 2012-08-19 NOTE — Progress Notes (Addendum)
D: Patient appeared sad and depressed at the beginning of the shift. She endorsed a rough day, she stated that her head hurts all day and asked if someone could  check her blood pressure; her B/P was slightly elevated in the morning. Patient denied SI/HI and denied  Hallucinations. She also said she didn't sleep good yesterday.  A: Writer consulted with the PA about either increasing the B/P medication, prescrinbing something steronger that Tylenol 650 mg for headache or increasing the dosage of  her Trazodone to help her sleep better. PA agreed to add NSAID for the headache and increase the Trazodone. MHT to check patient's blood pressure tonight. R: Patient receptive to support and encouragement. Q 15 minute check continues as scheduled to maintain safety.  [id number] : Patient reported feeling a lot better this am, she endorsed sleeping well last night, denied headache and her blood pressure improved 128/81.

## 2012-08-19 NOTE — Progress Notes (Signed)
Recreation Therapy Notes  Date: 03.26.2014 Time: 3:00pm Location: [facility]      Group Topic/Focus: Goal Setting  Participation Level: Active  Participation Quality: Appropriate  Affect: Flat  Cognitive: Appropriate  Additional Comments: Patient with peer play "Joined at the Hip" a game that required patients hold a beach ball between their hips and set a goal for how far they think they can walk without the ball dropping to the floor. Patient with peer set goal and reached goal. Patient with peer set goal for how far they could walk holding the beach ball with one finger. Patient with peer reached goal. Patient offered three suggestions to peer for reaching personal goal. Patient actively participated in group activity.    Marykay Lex Tamsin Nader, LRT/CTRS  Rozella Servello L 08/19/2012 4:07 PM

## 2012-08-19 NOTE — Tx Team (Signed)
Interdisciplinary Treatment Plan Update   Date Reviewed:  08/19/2012  Time Reviewed:  10:06 AM  Progress in Treatment:   Attending groups: Yes Participating in groups: Yes Taking medication as prescribed: Yes  Tolerating medication: Yes Family/Significant other contact made: Patient to be asked for consent to contact family  Patient understands diagnosis: Yes  Discussing patient identified problems/goals with staff: Yes Medical problems stabilized or resolved: Yes Denies suicidal/homicidal ideation: Yes Patient has not harmed self or others: Yes  For review of initial/current patient goals, please see plan of care.  Estimated Length of Stay:  3-5 days  Reasons for Continued Hospitalization:  Anxiety Depression Medication stabilization Suicidal ideation  New Problems/Goals identified:    Discharge Plan or Barriers:   Home with outpatient follow up to be scheduled  Additional Comments:  Patient reports admitting to hospital with SI with plan to drive car into something or off a bridge. She currently denies SI/HI.  She rates depression at nine/ten and anxiety and all other symptoms at eight.    Attendees:  Patient: Jane Snyder 08/19/2012 10:06 AM   Signature: Patrick North, MD 08/19/2012 10:06 AM  Signature:Tina Arlana Pouch, RN 08/19/2012 10:06 AM  Signature: Harold Barban, RN 08/19/2012 10:06 AM  Signature: 08/19/2012 10:06 AM  Signature:   08/19/2012 10:06 AM  Signature:  Juline Patch, LCSW 08/19/2012 10:06 AM  Signature:  08/19/2012 10:06 AM  Signature:  08/19/2012 10:06 AM  Signature: Fransisca Kaufmann, [facility] 08/19/2012 10:06 AM  Signature:    Signature:    Signature:      Scribe for Treatment Team:   Juline Patch,  08/19/2012 10:06 AM
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
## 2012-08-19 NOTE — Progress Notes (Signed)
[facility] LCSW Aftercare Discharge Planning Group Note  08/19/2012 10:21 AM  Participation Quality:  Appropriate and Attentive  Affect:  Appropriate, Blunted, Depressed and Flat  Cognitive:  Alert and Appropriate  Insight:  Engaged  Engagement in Group:  Engaged  Modes of Intervention:  Education, Exploration, Problem-solving, Rapport Building and Support  Summary of Progress/Problems: Patient advised of admitting to hospital with SI.  She currently denies SI/HI.  She rates depression at nine/ten and anxiety and other symptoms at eight.  She reports having home and transportation. She will need assistance with outpatient follow up.  Wynn Banker 08/19/2012, 10:21 AM

## 2012-08-20 NOTE — Progress Notes (Signed)
D: Patient is having passive SI but verbally agrees to contract for safety. Patient denies any HI or auditory and visual hallucinations. The patient has a depressed mood and affect. The patient reports sleeping fairly well and states that her appetite and energy levels are normal. The patient is interacting appropriately within the milieu and is attending some groups.  A: Patient given emotional support from RN. Patient encouraged to come to staff with concerns and/or questions. Patient's medication routine continued. Patient's orders and plan of care reviewed.  R: Patient remains cooperative. Will continue to monitor patient q15 minutes for safety.

## 2012-08-20 NOTE — Progress Notes (Signed)
[facility] LCSW Aftercare Discharge Planning Group Note  08/20/2012 2:28 PM  Participation Quality:  Appropriate and Attentive  Affect:  Appropriate  Cognitive:  Appropriate  Insight:  Engaged  Engagement in Group:  Engaged  Modes of Intervention:  Education, Exploration, Dentist, Rapport Building and Support  Summary of Progress/Problems:  Patient reports doing better today and denies SI/HI.  She rates depression at five and anxiety at four.  Patient shared being here has given her time to think and problems are not as overwhelming today.  Wynn Banker 08/20/2012, 2:28 PM

## 2012-08-20 NOTE — Progress Notes (Signed)
[facility] LCSW Group Therapy              [facility] 1:15 - 2:30 PM   08/20/2012 2:30 PM  Type of Therapy:  Group Therapy  Participation Level: Limited  Participation Quality:  Appropriate and Attentive  Affect:  Appropriate  Cognitive:  Appropriate  Insight:  Engaged  Engagement in Therapy:  Engaged  Modes of Intervention:  Discussion, Education, Exploration, Problem-solving, Rapport Building and Support  Summary of Progress/Problems: Patient listened attentively to speaker from [facility] but made no comments on presentation.  Wynn Banker 08/20/2012, 2:30 PM

## 2012-08-20 NOTE — Progress Notes (Signed)
[facility] MD Progress Note  08/20/2012 2:35 PM Jane Snyder  MRN:  [id number] Subjective:  Patient reports sleeping better. Continues to feel depressed, ruminating about the multiple stressors in her life. She is tolerating the zoloft well without side effects. Diagnosis:   Axis I: Major Depression, Recurrent severe Axis II: Deferred Axis III:  Past Medical History  Diagnosis Date  . Asthma   . Depression   . Suicide attempt    Axis IV: occupational problems and other psychosocial or environmental problems Axis V: 41-50 serious symptoms  ADL's:  Intact  Sleep: Fair  Appetite:  Fair   Psychiatric Specialty Exam: Review of Systems  Constitutional: Negative.   HENT: Negative.   Eyes: Negative.   Respiratory: Negative.   Cardiovascular: Negative.   Gastrointestinal: Negative.   Genitourinary: Negative.   Musculoskeletal: Negative.   Skin: Negative.   Neurological: Negative.   Endo/Heme/Allergies: Negative.   Psychiatric/Behavioral: Positive for depression. The patient is nervous/anxious.     Blood pressure 128/81, pulse 85, temperature 98.7 F (37.1 C), temperature source Oral, resp. rate 16, height 5' 2.6" (1.59 m), weight 92.08 kg (203 lb), last menstrual period 08/04/2012.Body mass index is 36.42 kg/(m^2).  General Appearance: Casual  Eye Contact::  Fair  Speech:  Slow  Volume:  Decreased  Mood:  Depressed and Dysphoric  Affect:  Depressed and Flat  Thought Process:  Coherent  Orientation:  Full (Time, Place, and Person)  Thought Content:  Rumination  Suicidal Thoughts:  Yes.  without intent/plan  Homicidal Thoughts:  No  Memory:  Immediate;   Fair Recent;   Fair Remote;   Fair  Judgement:  Fair  Insight:  Fair  Psychomotor Activity:  Decreased  Concentration:  Fair  Recall:  Fair  Akathisia:  No  Handed:  Right  AIMS (if indicated):     Assets:  Communication Skills Desire for Improvement Housing Social Support  Sleep:  Number of Hours: 6.5    Current Medications: Current Facility-Administered Medications  Medication Dose Route Frequency Provider Last Rate Last Dose  . acetaminophen (TYLENOL) tablet 650 mg  650 mg Oral Q6H PRN Karolee Stamps, NP   650 mg at 08/19/12 1810  . albuterol (PROVENTIL HFA;VENTOLIN HFA) 108 (90 BASE) MCG/ACT inhaler 2 puff  2 puff Inhalation Q6H PRN Nanine Means, NP      . alum & mag hydroxide-simeth (MAALOX/MYLANTA) 200-200-20 MG/5ML suspension 30 mL  30 mL Oral Q4H PRN Karolee Stamps, NP      . amLODipine (NORVASC) tablet 5 mg  5 mg Oral Daily Kerry Hough, PA-C   5 mg at 08/20/12 4098  . hydrOXYzine (ATARAX/VISTARIL) tablet 50 mg  50 mg Oral QHS,MR X 1 Kerry Hough, PA-C   50 mg at 08/19/12 2157  . ibuprofen (ADVIL,MOTRIN) tablet 600 mg  600 mg Oral Q6H PRN Kerry Hough, PA-C   600 mg at 08/19/12 2149  . magnesium hydroxide (MILK OF MAGNESIA) suspension 30 mL  30 mL Oral Daily PRN Karolee Stamps, NP      . sertraline (ZOLOFT) tablet 25 mg  25 mg Oral Daily Bennie Scaff, MD   25 mg at 08/20/12 1191    Lab Results: No results found for this or any previous visit (from the past 48 hour(s)).  Physical Findings: AIMS:  , ,  ,  ,    CIWA:    COWS:     Treatment Plan Summary: Daily contact with patient to assess and evaluate symptoms and  progress in treatment Medication management  Plan: Continue current plan of care. Patient urged to focus on herself, encouraged to attend groups and participate.  Medical Decision Making Problem Points:  Established problem, stable/improving (1), Review of last therapy session (1) and Review of psycho-social stressors (1) Data Points:  Review of medication regiment & side effects (2) Review of new medications or change in dosage (2)  I certify that inpatient services furnished can reasonably be expected to improve the patient's condition.   [name] 08/20/2012, 2:35 PM

## 2012-08-21 MED ORDER — TRAZODONE HCL 50 MG PO TABS
50.0000 mg | ORAL_TABLET | Freq: Every evening | ORAL | Status: DC | PRN
  Administered 2012-08-21: 50 mg via ORAL
  Filled 2012-08-21: qty 1
  Filled 2012-08-21: qty 14

## 2012-08-21 MED ORDER — SERTRALINE HCL 50 MG PO TABS
50.0000 mg | ORAL_TABLET | Freq: Every day | ORAL | Status: DC
  Administered 2012-08-22 – 2012-08-24 (×3): 50 mg via ORAL
  Filled 2012-08-21 (×4): qty 1

## 2012-08-21 NOTE — Progress Notes (Signed)
[facility] LCSW Aftercare Discharge Planning Group Note  08/21/2012 10:25 AM  Participation Quality:  Appropriate and Attentive  Affect:  Appropriate and Depressed  Cognitive:  Alert and Appropriate  Insight:  Engaged  Engagement in Group:  Engaged  Modes of Intervention:  Exploration, Limit-setting, Problem-solving, Rapport Building and Support  Summary of Progress/Problems:  Patient reports being much better today.  She denies SI/HI and rates depression and anxiety at four/five.   Patient is agreeable to referral to [facility] for follow up.  Wynn Banker 08/21/2012, 10:25 AM

## 2012-08-21 NOTE — Progress Notes (Signed)
[facility] LCSW Group Therapy        Feelings Around Relapse        1:15-2:30 PM    08/21/2012 2:59 PM  Type of Therapy:  Group Therapy  Participation Level:  Did Not Attend    Wynn Banker 08/21/2012, 2:59 PM

## 2012-08-21 NOTE — Progress Notes (Signed)
D: Patient having passive SI but verbally agrees to contract for safety. The patient denies any HI and auditory and visual hallucinations. The patient has a depressed mood and affect. The patient rates her depression a 5 out of 10 and her hopelessness a 1 out of 10 (1 low/10 high). The patient is attending groups and interacting appropriately within the milieu.  A: Patient given emotional support from RN. Patient encouraged to come to staff with concerns and/or questions. Patient's medication routine continued. Patient's orders and plan of care reviewed.  R: Patient remains cooperative. Will continue to monitor patient q15 minutes for safety.

## 2012-08-21 NOTE — Progress Notes (Signed)
D   Pt looks sad and depressed   She did attend group   She interacts minimally but appropriately with others    A   Verbal support given  Medications administered and effectiveness monitored   Q 15 min checks R   Pt safe at present

## 2012-08-21 NOTE — Progress Notes (Signed)
[facility] MD Progress Note  08/21/2012 12:04 PM Jane Snyder  MRN:  [id number] Subjective:  Patient reports she continues to worry about her children, sleeping poorly. Anxious to get to her interview on Monday, feeling somewhat hopeful about her job situation. Tolerating Zoloft well.  Diagnosis:   Axis I: Major Depression, Recurrent severe Axis II: Deferred Axis III:  Past Medical History  Diagnosis Date  . Asthma   . Depression   . Suicide attempt    Axis IV: other psychosocial or environmental problems Axis V: 41-50 serious symptoms  ADL's:  Intact  Sleep: Fair  Appetite:  Fair    Psychiatric Specialty Exam: Review of Systems  Constitutional: Negative.   HENT: Negative.   Eyes: Negative.   Respiratory: Negative.   Cardiovascular: Negative.   Gastrointestinal: Negative.   Genitourinary: Negative.   Musculoskeletal: Negative.   Skin: Negative.   Neurological: Negative.   Endo/Heme/Allergies: Negative.   Psychiatric/Behavioral: Positive for depression. The patient is nervous/anxious and has insomnia.     Blood pressure 127/87, pulse 81, temperature 98.3 F (36.8 C), temperature source Oral, resp. rate 16, height 5' 2.6" (1.59 m), weight 92.08 kg (203 lb), last menstrual period 08/04/2012.Body mass index is 36.42 kg/(m^2).  General Appearance: Casual  Eye Contact::  Fair  Speech:  Slow  Volume:  Decreased  Mood:  Anxious and Depressed  Affect:  Depressed  Thought Process:  Coherent  Orientation:  Full (Time, Place, and Person)  Thought Content:  Rumination  Suicidal Thoughts:  No  Homicidal Thoughts:  No  Memory:  Immediate;   Fair Recent;   Fair Remote;   Fair  Judgement:  Fair  Insight:  Fair  Psychomotor Activity:  Decreased  Concentration:  Fair  Recall:  Fair  Akathisia:  No  Handed:  Right  AIMS (if indicated):     Assets:  Communication Skills Desire for Improvement Housing Social Support  Sleep:  Number of Hours: 5.25   Current  Medications: Current Facility-Administered Medications  Medication Dose Route Frequency Provider Last Rate Last Dose  . acetaminophen (TYLENOL) tablet 650 mg  650 mg Oral Q6H PRN Karolee Stamps, NP   650 mg at 08/19/12 1810  . albuterol (PROVENTIL HFA;VENTOLIN HFA) 108 (90 BASE) MCG/ACT inhaler 2 puff  2 puff Inhalation Q6H PRN Nanine Means, NP      . alum & mag hydroxide-simeth (MAALOX/MYLANTA) 200-200-20 MG/5ML suspension 30 mL  30 mL Oral Q4H PRN Karolee Stamps, NP      . amLODipine (NORVASC) tablet 5 mg  5 mg Oral Daily Kerry Hough, PA-C   5 mg at 08/21/12 0756  . ibuprofen (ADVIL,MOTRIN) tablet 600 mg  600 mg Oral Q6H PRN Kerry Hough, PA-C   600 mg at 08/20/12 2146  . magnesium hydroxide (MILK OF MAGNESIA) suspension 30 mL  30 mL Oral Daily PRN Karolee Stamps, NP      . Melene Muller ON 08/22/2012] sertraline (ZOLOFT) tablet 50 mg  50 mg Oral Daily Noah Pelaez, MD      . traZODone (DESYREL) tablet 50 mg  50 mg Oral QHS PRN Takya Vandivier, MD        Lab Results: No results found for this or any previous visit (from the past 48 hour(s)).  Physical Findings: AIMS:  , ,  ,  ,    CIWA:    COWS:     Treatment Plan Summary: Daily contact with patient to assess and evaluate symptoms and progress in treatment Medication  management  Plan: Increase Zoloft to 50mg  po qd. Discontinue vistaril. Start Trazodone at 50mg  po qd to target insomnia. Continue to monitor,plan for discharge on Monday if stable.  Medical Decision Making Problem Points:  Established problem, stable/improving (1), Review of last therapy session (1) and Review of psycho-social stressors (1) Data Points:  Review of medication regiment & side effects (2) Review of new medications or change in dosage (2)  I certify that inpatient services furnished can reasonably be expected to improve the patient's condition.   Jane Snyder 08/21/2012, 12:04 PM

## 2012-08-21 NOTE — Progress Notes (Signed)
[facility] LCSW Aftercare Discharge Planning Group Note  08/21/2012 11:26 AM  Participation Quality:  Appropriate and Attentive  Affect:  Appropriate  Cognitive:  Appropriate  Insight:  Engaged  Engagement in Group:  Engaged  Modes of Intervention:  Exploration, Problem-solving, Rapport Building and Support  Summary of Progress/Problems:  Patient reports doing better today.  She denies SI/HI and rates depression/anxiety at four/five.    Wynn Banker 08/21/2012, 11:26 AM

## 2012-08-22 DIAGNOSIS — G43909 Migraine, unspecified, not intractable, without status migrainosus: Secondary | ICD-10-CM | POA: Diagnosis present

## 2012-08-22 DIAGNOSIS — F332 Major depressive disorder, recurrent severe without psychotic features: Principal | ICD-10-CM

## 2012-08-22 MED ORDER — AMITRIPTYLINE HCL 25 MG PO TABS
25.0000 mg | ORAL_TABLET | Freq: Every day | ORAL | Status: DC
  Administered 2012-08-22 – 2012-08-23 (×2): 25 mg via ORAL
  Filled 2012-08-22 (×3): qty 1

## 2012-08-22 MED ORDER — AMITRIPTYLINE HCL 25 MG PO TABS
25.0000 mg | ORAL_TABLET | Freq: Every day | ORAL | Status: DC
  Filled 2012-08-22: qty 1

## 2012-08-22 NOTE — Progress Notes (Signed)
D) Pt has been attending the groups and interacting with her peers. Rates her depression at a 4 and her hopelessness at a 1. Denies SI and HI. A) Given support and reassurance along with praise. Encouraged to work on her packet and to be able to go deep into some of the reasons that brought her to the hospital. R) Denies SI and HI.

## 2012-08-22 NOTE — Progress Notes (Signed)
 .  Psychoeducational Group Note    Date: 08/22/2012 Time:  @T@    Goal Setting Purpose of Group: To be able to set a goal that is measurable and that can be accomplished in one day Participation Level:  Active  Participation Quality:  Appropriate  Affect:  Appropriate  Cognitive:  Appropriate  Insight:  Improving  Engagement in Group:  Engaged  Additional Comments:    Jane Snyder A 

## 2012-08-22 NOTE — Clinical Social Work Note (Signed)
[facility] Group Notes:  (Clinical Social Work)  08/22/2012   3:00-4:00PM  Summary of Progress/Problems:   The main focus of today's process group was for the patient to identify something in their life that led to their hospitalization that they would like to change, then to discuss their motivation to change.  The Stages of Change were explained to the group, then each patient identified where they are in that process.  Scale was used with motivational interviewing to determine the patient's current motivation to change..  The patient expressed that she cannot change the situation which brought her to the hospital, but can accept it.  She is in the preparation stage of change.  Type of Therapy:  Process Group  Participation Level:  Minimal  Participation Quality:  Attentive  Affect:  Blunted and Depressed  Cognitive:  Appropriate and Oriented  Insight:  Developing/Improving  Engagement in Therapy:  Developing/Improving  Modes of Intervention:  Clarification, Support and Processing, Exploration, Discussion   Ambrose Mantle, LCSW 08/22/2012, 5:23 PM

## 2012-08-22 NOTE — Progress Notes (Signed)
Psychoeducational Group Note  Date: 08/22/2012 Time:  1015  Group Topic/Focus:  Identifying Needs:   The focus of this group is to help patients identify their personal needs that have been historically problematic and identify healthy behaviors to address their needs.  Participation Level:  Active  Participation Quality:  Appropriate  Affect:  Appropriate  Cognitive:  Appropriate  Insight:  Improving  Engagement in Group:  Engaged  Additional Comments:  Paid close attention to all that was being said and participated and gave input.   Jaquail Mclees A 

## 2012-08-22 NOTE — Progress Notes (Signed)
[facility] MD Progress Note  08/22/2012 12:31 PM Jane Snyder  MRN:  [id number] Subjective:  Patient noting headaches since starting the Zoloft.  Agreeable to starting Elavil for that.  Advised that it may increase thoughts about consuming sugar.  Given post discharge instructions about cutting back on sugar and gluten that is toxic to the depressed brain.   Depression 4 to 5/10 and Anxiety 3/10, where 1 is the best and 10 is the worst.   Diagnosis:   Axis I: Major Depression, Recurrent severe Axis II: Deferred Axis III:  Past Medical History  Diagnosis Date  . Asthma   . Depression   . Suicide attempt    Axis IV: other psychosocial or environmental problems Axis V: 41-50 serious symptoms  ADL's:  Intact  Sleep: Fair  Appetite:  Fair  Psychiatric Specialty Exam: Review of Systems  Constitutional: Negative.   HENT: Negative.   Eyes: Negative.   Respiratory: Negative.   Cardiovascular: Negative.   Gastrointestinal: Negative.   Genitourinary: Negative.   Musculoskeletal: Negative.   Skin: Negative.   Neurological: Negative.   Endo/Heme/Allergies: Negative.   Psychiatric/Behavioral: Positive for depression. The patient has insomnia.     Blood pressure 114/83, pulse 87, temperature 98.6 F (37 C), temperature source Oral, resp. rate 18, height 5' 2.6" (1.59 m), weight 92.08 kg (203 lb), last menstrual period 08/04/2012.Body mass index is 36.42 kg/(m^2).  General Appearance: Casual  Eye Contact::  Fair  Speech:  Slow  Volume:  Decreased  Mood:  Anxious and Depressed  Affect:  Depressed  Thought Process:  Coherent  Orientation:  Full (Time, Place, and Person)  Thought Content:  Rumination  Suicidal Thoughts:  No, denies  Homicidal Thoughts:  No, denies  Memory:  Immediate;   Fair Recent;   Fair Remote;   Fair  Judgement:  Fair  Insight:  Fair  Psychomotor Activity:  Decreased  Concentration:  Fair  Recall:  Fair  Akathisia:  No  Handed:  Right  AIMS (if  indicated):     Assets:  Communication Skills Desire for Improvement Housing Social Support  Sleep:  Number of Hours: 6.75   Current Medications: Current Facility-Administered Medications  Medication Dose Route Frequency Provider Last Rate Last Dose  . acetaminophen (TYLENOL) tablet 650 mg  650 mg Oral Q6H PRN Karolee Stamps, NP   650 mg at 08/19/12 1810  . albuterol (PROVENTIL HFA;VENTOLIN HFA) 108 (90 BASE) MCG/ACT inhaler 2 puff  2 puff Inhalation Q6H PRN Nanine Means, NP      . alum & mag hydroxide-simeth (MAALOX/MYLANTA) 200-200-20 MG/5ML suspension 30 mL  30 mL Oral Q4H PRN Karolee Stamps, NP      . amitriptyline (ELAVIL) tablet 25 mg  25 mg Oral QHS Mike Craze, MD      . amLODipine (NORVASC) tablet 5 mg  5 mg Oral Daily Kerry Hough, PA-C   5 mg at 08/22/12 4098  . ibuprofen (ADVIL,MOTRIN) tablet 600 mg  600 mg Oral Q6H PRN Kerry Hough, PA-C   600 mg at 08/22/12 1200  . magnesium hydroxide (MILK OF MAGNESIA) suspension 30 mL  30 mL Oral Daily PRN Karolee Stamps, NP   30 mL at 08/21/12 1520  . sertraline (ZOLOFT) tablet 50 mg  50 mg Oral Daily Himabindu Ravi, MD   50 mg at 08/22/12 0831  . traZODone (DESYREL) tablet 50 mg  50 mg Oral QHS PRN Himabindu Ravi, MD   50 mg at 08/21/12 2136  Lab Results: No results found for this or any previous visit (from the past 48 hour(s)).  Physical Findings: AIMS:  , ,  ,  ,    CIWA:    COWS:     Treatment Plan Summary: Daily contact with patient to assess and evaluate symptoms and progress in treatment Medication management  Plan: Increase Zoloft to 50mg  po qd. Discontinue vistaril. Start Trazodone at 50mg  po qd to target insomnia. Continue to monitor,plan for discharge on Monday if stable.  Medical Decision Making Problem Points:  Established problem, stable/improving (1), New problem, with no additional work-up planned (3), Review of last therapy session (1) and Review of psycho-social stressors (1) Data Points:   Review or order clinical lab tests (1) Review of medication regiment & side effects (2) Review of new medications or change in dosage (2)  I certify that inpatient services furnished can reasonably be expected to improve the patient's condition.   Eeshan Verbrugge 08/22/2012, 12:31 PM

## 2012-08-22 NOTE — Progress Notes (Signed)
[facility] Group Notes:  (Nursing/MHT/Case Management/Adjunct)  Date:  08/21/2012 Time:  2000  Type of Therapy:  Psychoeducational Skills  Participation Level:  Active  Participation Quality:  Attentive  Affect:  Appropriate  Cognitive:  Appropriate  Insight:  Good  Engagement in Group:  Engaged  Modes of Intervention:  Education  Summary of Progress/Problems: The patient verbalized that she had a good talk with her physician today. Her goal for tomorrow is to "make it through the weekend" since she expects to be discharged on Monday.   Jane Snyder S 08/22/2012, 1:27 AM

## 2012-08-22 NOTE — Progress Notes (Signed)
Patient ID: ALEXIUS ELLINGTON, female   DOB: 01-05-1971, 42 y.o.   MRN: [id number] D)  Has spent most of the evening in the dayroom, watched tv, attended group, interacting appropriately with peers and staff.  Still feeling depressed, some passive SI, able to contract for safety and agreed to come to staff. States is just trying to get through the weekend, hoping to feel better. A) A)  Support, continue to monitor for safety, continue POC.   R)  Remains safe on unit at this time, participating in milieu.

## 2012-08-22 NOTE — Progress Notes (Signed)
Patient ID: Jane Snyder, female   DOB: [birth year], 42 y.o.   MRN: [id number] D)  Has been out and about more this evening, interacting appropriately with staff and select peers.  Was medicated for headache earlier, with relief, fluids encouraged.  Attended group, came to med window after a snack.  Affect is brighter this evening. A)  Will continue to monitor for safety, support, continue POC. R)  Remains safe on unit at this time.

## 2012-08-23 MED ORDER — BACLOFEN 10 MG PO TABS
10.0000 mg | ORAL_TABLET | ORAL | Status: AC
  Administered 2012-08-23: 10 mg via ORAL
  Filled 2012-08-23: qty 1

## 2012-08-23 MED ORDER — BACLOFEN 5 MG HALF TABLET
5.0000 mg | ORAL_TABLET | Freq: Three times a day (TID) | ORAL | Status: DC
  Administered 2012-08-23: 5 mg via ORAL
  Administered 2012-08-24: 08:00:00 via ORAL
  Filled 2012-08-23 (×6): qty 1

## 2012-08-23 NOTE — Progress Notes (Signed)
  Psychoeducational Group Note  Date: 08/23/2012 Time:  08/23/2012  Group Topic/Focus:  Gratefulness:  The focus of this group is to help patients identify what two things they are most grateful for in their lives. What helps ground them and to center them on their work to their recovery.  Participation Level:  Active  Participation Quality:  Appropriate  Affect:  Appropriate  Cognitive:  Appropriate  Insight:  Improving  Engagement in Group:  Engaged  Additional Comments:    Dione Housekeeper

## 2012-08-23 NOTE — Progress Notes (Signed)
Psychoeducational Group Note  Date:  08/23/2012 Time:  1015  Group Topic/Focus:  Making Healthy Choices:   The focus of this group is to help patients identify negative/unhealthy choices they were using prior to admission and identify positive/healthier coping strategies to replace them upon discharge.  Participation Level:  Did not attend    Nethra Mehlberg A 08/23/2012 

## 2012-08-23 NOTE — Progress Notes (Signed)
D) Pt has had a bit of discomfort today due to her sleeping position last night. States that her neck hurts badly. Pt has slept today on and off. Given encouragement and praise. Getting up for meals. Rates her depression at a 4 and her hopelessness at a 1. A) Given support and reassurance. Not pushing Pt hard today due to pain and discomfort. R) Denies SI and H.

## 2012-08-23 NOTE — Progress Notes (Signed)
[facility] Group Notes:  (Nursing/MHT/Case Management/Adjunct)  Date:  08/22/2012 Time:  2000  Type of Therapy:  Psychoeducational Skills  Participation Level:  Active  Participation Quality:  Attentive  Affect:  Appropriate  Cognitive:  Appropriate  Insight:  Improving  Engagement in Group:  Engaged  Modes of Intervention:  Education  Summary of Progress/Problems: The patient shared with the group this evening that she had a good day. She acknowledged that she is still in the process of adjusting to her medications. Her goal for tomorrow is to prepare herself for discharge.   Gisel Vipond S 08/23/2012, 4:12 AM

## 2012-08-23 NOTE — Progress Notes (Signed)
[facility] MD Progress Note  08/23/2012 10:08 AM Jane Snyder  MRN:  [id number] Subjective:  Patient noting some backaches that interfered with her sleep last PM.  She is working hard on looking honestly at herself and taking responsibility only for her actions and not those of others.  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 Depression 4/10 and Anxiety 6/10, where 0 is none and 10 is the worst.  Her anxiety is up about being excited about leaving tomorrow.  She has a job interview tomorrow PM.   Diagnosis:   Axis I: Major Depression, Recurrent severe Axis II: Deferred Axis III:  Past Medical History  Diagnosis Date  . Asthma   . Depression   . Suicide attempt    Axis IV: other psychosocial or environmental problems Axis V: 41-50 serious symptoms  ADL's:  Intact  Sleep: Good, but slept the wrong way and her back and neck are hurting.   Appetite:  Good  Psychiatric Specialty Exam: Review of Systems  Constitutional: Negative.   HENT: Negative.   Eyes: Negative.   Respiratory: Negative.   Cardiovascular: Negative.   Gastrointestinal: Negative.   Genitourinary: Negative.   Musculoskeletal: Negative.   Skin: Negative.   Neurological: Negative.   Endo/Heme/Allergies: Negative.   Psychiatric/Behavioral: Positive for depression. The patient has insomnia.     Blood pressure 132/93, pulse 87, temperature 98.8 F (37.1 C), temperature source Oral, resp. rate 16, height 5' 2.6" (1.59 m), weight 92.08 kg (203 lb), last menstrual period 08/04/2012.Body mass index is 36.42 kg/(m^2).  General Appearance: Casual  Eye Contact::  Fair  Speech:  Slow  Volume:  Decreased  Mood:  Euthymic  Affect:  Depressed  Thought Process:  Coherent  Orientation:  Full (Time, Place, and Person)  Thought Content:  Rumination  Suicidal Thoughts:  No, denies  Homicidal Thoughts:  No, denies  Memory:  Immediate;   Good Recent;   Good Remote;   Good  Judgement:  Good  Insight:  Fair  Psychomotor Activity:   Decreased  Concentration:  Good  Recall:  Good  Akathisia:  No  Handed:  Right  AIMS (if indicated):     Assets:  Communication Skills Desire for Improvement Housing Social Support  Sleep:  Number of Hours: 6.5   Current Medications: Current Facility-Administered Medications  Medication Dose Route Frequency Provider Last Rate Last Dose  . acetaminophen (TYLENOL) tablet 650 mg  650 mg Oral Q6H PRN Karolee Stamps, NP   650 mg at 08/19/12 1810  . albuterol (PROVENTIL HFA;VENTOLIN HFA) 108 (90 BASE) MCG/ACT inhaler 2 puff  2 puff Inhalation Q6H PRN Nanine Means, NP      . alum & mag hydroxide-simeth (MAALOX/MYLANTA) 200-200-20 MG/5ML suspension 30 mL  30 mL Oral Q4H PRN Karolee Stamps, NP      . amitriptyline (ELAVIL) tablet 25 mg  25 mg Oral QHS Mike Craze, MD   25 mg at 08/22/12 2141  . amLODipine (NORVASC) tablet 5 mg  5 mg Oral Daily Kerry Hough, PA-C   5 mg at 08/23/12 0931  . baclofen (LIORESAL) tablet 10 mg  10 mg Oral NOW Mike Craze, MD      . baclofen (LIORESAL) tablet 5 mg  5 mg Oral TID Mike Craze, MD      . ibuprofen (ADVIL,MOTRIN) tablet 600 mg  600 mg Oral Q6H PRN Kerry Hough, PA-C   600 mg at 08/23/12 4098  . magnesium hydroxide (MILK OF MAGNESIA) suspension 30  mL  30 mL Oral Daily PRN Karolee Stamps, NP   30 mL at 08/21/12 1520  . sertraline (ZOLOFT) tablet 50 mg  50 mg Oral Daily Himabindu Ravi, MD   50 mg at 08/23/12 0931  . traZODone (DESYREL) tablet 50 mg  50 mg Oral QHS PRN Himabindu Ravi, MD   50 mg at 08/21/12 2136    Lab Results: No results found for this or any previous visit (from the past 48 hour(s)).  Physical Findings: AIMS: Facial and Oral Movements Muscles of Facial Expression: None, normal Lips and Perioral Area: None, normal Jaw: None, normal Tongue: None, normal,Extremity Movements Upper (arms, wrists, hands, fingers): None, normal Lower (legs, knees, ankles, toes): None, normal, Trunk Movements Neck, shoulders, hips: None,  normal, Overall Severity Severity of abnormal movements (highest score from questions above): None, normal Incapacitation due to abnormal movements: None, normal Patient's awareness of abnormal movements (rate only patient's report): No Awareness, Dental Status Current problems with teeth and/or dentures?: No Does patient usually wear dentures?: No  CIWA:    COWS:     Treatment Plan Summary: Daily contact with patient to assess and evaluate symptoms and progress in treatment Medication management  Plan: Suggest Adult Children of Alcoholics and Dysfunctional Families for her continued personal growth. Continue to monitor,plan for discharge on Monday if stable.  Medical Decision Making Problem Points:  Established problem, stable/improving (1), Review of last therapy session (1) and Review of psycho-social stressors (1) Data Points:  Review or order clinical lab tests (1) Review of medication regiment & side effects (2) Review of new medications or change in dosage (2)  I certify that inpatient services furnished can reasonably be expected to improve the patient's condition.   Notnamed Croucher 08/23/2012, 10:08 AM

## 2012-08-23 NOTE — Clinical Social Work Note (Signed)
[facility] Group Notes:  (Clinical Social Work)  08/23/2012   3:00-4:00PM  Summary of Progress/Problems:    The main focus of today's process group was to define "support" and describe what healthy supports are.  We then discussed how and why to increase patient supports, using motivational interviewing.  An emphasis was placed on using counselor, doctor, therapy groups, self-help groups and problem-specific support groups to expand supports.   The patient expressed little, but was attentive.  Type of Therapy:  Process Group  Participation Level:  Active  Participation Quality:  Attentive  Affect:  Appropriate  Cognitive:  Appropriate  Insight:  Engaged  Engagement in Therapy:  Engaged  Modes of Intervention:  Education,  Teacher, English as a foreign language, Exploration, Discussion   Ambrose Mantle, LCSW 08/23/2012, 5:00 PM

## 2012-08-23 NOTE — Progress Notes (Signed)
D - Patient active on the department interacting with peers appropriately. Patient attended evening group tonight participating appropriately. Patient denies SI/HI and also patient denies any auditory or visual hallucinations. Patient in hall talking with peers, friendly and appropriate.  A - Patient offered encouragement and support with therapeutic conversation. Encouraged patient to speak with staff about any concerns or questions. Medications given as ordered. R - Patient safety maintained with Q 15 minute checks. Patient remains safe on the department.

## 2012-08-24 DIAGNOSIS — F329 Major depressive disorder, single episode, unspecified: Secondary | ICD-10-CM

## 2012-08-24 MED ORDER — AMITRIPTYLINE HCL 25 MG PO TABS: 25.0000 mg | ORAL_TABLET | Freq: Every day | ORAL | Status: DC

## 2012-08-24 MED ORDER — AMLODIPINE BESYLATE 5 MG PO TABS: 5.0000 mg | ORAL_TABLET | Freq: Every day | ORAL | Status: DC

## 2012-08-24 MED ORDER — BACLOFEN 5 MG HALF TABLET: 5.0000 mg | ORAL_TABLET | Freq: Three times a day (TID) | ORAL | Status: DC

## 2012-08-24 MED ORDER — TRAZODONE HCL 50 MG PO TABS: 50.0000 mg | ORAL_TABLET | Freq: Every evening | ORAL | Status: DC | PRN

## 2012-08-24 MED ORDER — ALBUTEROL SULFATE HFA 108 (90 BASE) MCG/ACT IN AERS: 2.0000 | INHALATION_SPRAY | Freq: Four times a day (QID) | RESPIRATORY_TRACT | Status: DC | PRN

## 2012-08-24 MED ORDER — SERTRALINE HCL 50 MG PO TABS: 50.0000 mg | ORAL_TABLET | Freq: Every day | ORAL | Status: DC

## 2012-08-24 NOTE — Progress Notes (Signed)
D:  Patient's self inventory sheet, patient sleeps well, has good appetite, normal energy level, good attention span.  Rated depression #4, hopelessness #1, anxiety #6.  Denied withdrawals. Denied SI.  Headache in past 24 hours.  Zero pain goal,  Worst pain #2.  Denied HI.  Denied A/V hallucinations.  Denied pain. A:  Medications administered per MD orders.  Support and encouragement given throughout day. R:  Following treatment plan. Denied SI and HI.  Denied A/V hallucinations.  Denied pain.

## 2012-08-24 NOTE — Discharge Summary (Signed)
Physician Discharge Summary Note  Patient:  Jane Snyder is an 42 y.o., female MRN:  [id number] DOB:  02-27-1971 Patient phone:  [phone] (home)  Patient address:   [address] [place] [address],   Date of Admission:  08/18/2012 Date of Discharge: 08/24/2012  Reason for Admission:  Depression with suicidal ideations  Discharge Diagnoses: Principal Problem:   Major depression Active Problems:   Headache  Review of Systems  Constitutional: Negative.   HENT: Negative.   Eyes: Negative.   Respiratory: Negative.   Cardiovascular: Negative.   Gastrointestinal: Negative.   Genitourinary: Negative.   Musculoskeletal: Negative.   Skin: Negative.   Neurological: Negative.   Endo/Heme/Allergies: Negative.   Psychiatric/Behavioral: Positive for depression. The patient is nervous/anxious.    Axis Diagnosis:   AXIS I:  Anxiety Disorder NOS and Major Depression, Recurrent severe AXIS II:  Deferred AXIS III:   Past Medical History  Diagnosis Date  . Asthma   . Depression   . Suicide attempt    AXIS IV:  other psychosocial or environmental problems, problems related to social environment and problems with primary support group AXIS V:  61-70 mild symptoms  Level of Care:  OP  Hospital Course:  On admission:  Jane Snyder presented to [facility] yesterday saying she is feeling suicidal and will drive her car into a bridge or into traffic. Today patient reports multiple stressors that include death of grandmother 2 weeks ago, her teenage daughter has a baby and informed her she is gay. Patient lost her job recently in January. Feeling depressed for several months, feeling hopeless. Has been sleeping poorly and does not have an appetite. Denies use of alcohol or drugs. Last sought care for depression 18 years ago.   During hospitalization, her medications were managed as follows:  Amitriptyline started for depression and sleep issues, Norvasc for blood pressure elevations,  Baclofen 5 mg TID for back pain, Zoloft 50 mg for her depression, and Trazodone for her sleep issues.  Patient attended and participated in groups and developed positive coping skills--she requested to discharge today so she could go to her job interview this afternoon.  Patient denied suicidal/homicidal ideations and auditory/visual hallucinations, follow-up appointments encouraged to attend, Rx and 14 day supply of medications given at discharge.  Jane Snyder is mentally and physically stable for discharge.  Consults:  None  Significant Diagnostic Studies:  labs: Completed and reviewed, stable  Discharge Vitals:   Blood pressure 133/91, pulse 80, temperature 98.6 F (37 C), temperature source Oral, resp. rate 18, height 5' 2.6" (1.59 m), weight 92.08 kg (203 lb), last menstrual period 08/04/2012. Body mass index is 36.42 kg/(m^2). Lab Results:   No results found for this or any previous visit (from the past 72 hour(s)).  Physical Findings: AIMS: Facial and Oral Movements Muscles of Facial Expression: None, normal Lips and Perioral Area: None, normal Jaw: None, normal Tongue: None, normal,Extremity Movements Upper (arms, wrists, hands, fingers): None, normal Lower (legs, knees, ankles, toes): None, normal, Trunk Movements Neck, shoulders, hips: None, normal, Overall Severity Severity of abnormal movements (highest score from questions above): None, normal Incapacitation due to abnormal movements: None, normal Patient's awareness of abnormal movements (rate only patient's report): No Awareness, Dental Status Current problems with teeth and/or dentures?: No Does patient usually wear dentures?: No  CIWA:  CIWA-Ar Total: 1 COWS:  COWS Total Score: 1  Psychiatric Specialty Exam: See Psychiatric Specialty Exam and Suicide Risk Assessment completed by Attending Physician prior to discharge.  Discharge  destination:  Home  Is patient on multiple antipsychotic therapies at discharge:  No    Has Patient had three or more failed trials of antipsychotic monotherapy by history:  No Recommended Plan for Multiple Antipsychotic Therapies:  N/A  Discharge Orders   Future Orders Complete By Expires     Activity as tolerated - No restrictions  As directed     Diet - low sodium heart healthy  As directed         Medication List    TAKE these medications     Indication   albuterol 108 (90 BASE) MCG/ACT inhaler  Commonly known as:  PROVENTIL HFA;VENTOLIN HFA  Inhale 2 puffs into the lungs every 6 (six) hours as needed for wheezing.      amitriptyline 25 MG tablet  Commonly known as:  ELAVIL  Take 1 tablet (25 mg total) by mouth at bedtime.   Indication:  Depression with Excitement and Restlessness, Headache, Trouble Sleeping     amLODipine 5 MG tablet  Commonly known as:  NORVASC  Take 1 tablet (5 mg total) by mouth daily.   Indication:  High Blood Pressure     baclofen 5 mg Tabs  Commonly known as:  LIORESAL  Take 0.5 tablets (5 mg total) by mouth 3 (three) times daily.   Indication:  back pain     sertraline 50 MG tablet  Commonly known as:  ZOLOFT  Take 1 tablet (50 mg total) by mouth daily.   Indication:  Major Depressive Disorder     traZODone 50 MG tablet  Commonly known as:  DESYREL  Take 1 tablet (50 mg total) by mouth at bedtime as needed for sleep.   Indication:  Trouble Sleeping           Follow-up Information   Follow up with [facility] On 08/25/2012. (You are scheduled with [facility] at  1:00 PM on Tuesday, August 25, 2012)    Contact information:   [address]  [place], [place]   [address]  [phone]      Follow up with [facility] On 08/26/2012. (Please go to [facility] walk-in clinic on Wedneday, August 26, 2012  or any weekday between 8AM-3PM for medication management)    Contact information:   [address] [place], [place]   [address] [phone]      Follow-up recommendations:  Activity:  As tolerated Diet:   Low-sodium heart healthy diet  Comments:  Patient will continue her care at [facility]  Total Discharge Time:  Greater than 30 minutes.  SignedNanine Means, PMH-NP 08/24/2012, 4:19 PM

## 2012-08-24 NOTE — Progress Notes (Signed)
Adult Psychoeducational Group Note  Date:  08/24/2012 Time:  7:13 PM  Group Topic/Focus:  Self Care:   The focus of this group is to help patients understand the importance of self-care in order to improve or restore emotional, physical, spiritual, interpersonal, and financial health.  Participation Level:  Active  Participation Quality:  Appropriate  Affect:  Appropriate  Cognitive:  Appropriate  Insight: Appropriate  Engagement in Group:  Engaged  Modes of Intervention:  Discussion, Education and Support  Additional Comments:  Pt participated in group discussion on the multi-faceted nature of the self. Pt actively participated in group by completing the self-care inventory and sharing areas in which she excels in her self-care, as well as areas in which she needs improvement.    Reinaldo Raddle K 08/24/2012, 7:13 PM

## 2012-08-24 NOTE — [facility] Suicide Risk Assessment (Signed)
Suicide Risk Assessment  Discharge Assessment     Demographic Factors:  NA  Mental Status Per Nursing Assessment::   On Admission:  Self-harm thoughts  Current Mental Status by Physician: In full contact with reality. There are no suicidal ideas, plans or intent. Her mood is euthymic. Her affect is appropriate. There are no suicidal ideas, plans or intent. She has been able to work on her coping skills. She is more acceptant of the situation with her daughter and the fact she has to establish clear boundaries.    Loss Factors: NA  Historical Factors: NA  Risk Reduction Factors:   Responsible for children under 38 years of age, Sense of responsibility to family, Employed and Living with another person, especially a relative  Continued Clinical Symptoms: Depression is improving, more hopefull   Cognitive Features That Contribute To Risk: None identified   Suicide Risk:  Minimal: No identifiable suicidal ideation.  Patients presenting with no risk factors but with morbid ruminations; may be classified as minimal risk based on the severity of the depressive symptoms  Discharge Diagnoses:   AXIS I:  Major Depression AXIS II:  Deferred AXIS III:   Past Medical History  Diagnosis Date  . Asthma   . Depression   . Suicide attempt    AXIS IV:  problems with primary support group AXIS V:  61-70 mild symptoms  Plan Of Care/Follow-up recommendations:  Activity:  As tolerated Diet:  regular Continue follow up with psychiatrist, therapist Is patient on multiple antipsychotic therapies at discharge:  No   Has Patient had three or more failed trials of antipsychotic monotherapy by history:  No  Recommended Plan for Multiple Antipsychotic Therapies: N/A   Margrit Minner A 08/24/2012, 11:32 AM

## 2012-08-24 NOTE — Tx Team (Signed)
Interdisciplinary Treatment Plan Update   Date Reviewed:  08/24/2012  Time Reviewed:  9:45 AM  Progress in Treatment:   Attending groups: Yes Participating in groups: Yes Taking medication as prescribed: Yes  Tolerating medication: Yes Family/Significant other contact made:  Contact   Discussing patient identified problems/goals with staff: Yes Medical problems stabilized or resolved: Yes Denies suicidal/homicidal ideation: Yes Patient has not harmed self or others: Yes  For review of initial/current patient goals, please see plan of care.  Estimated Length of Stay: Discharge home today  Reasons for Continued Hospitalization:   New Problems/Goals identified:    Discharge Plan or Barriers:   Home with outpatient follow up at [facility]  Additional Comments:  Patient reports doing much better and denies SI/HI.  She rates depression at four and anxiety four.  She reports having home and transportation.  She shared she has a job interview this afternoon and wants to discharge in time for the interiew. Attendees:  Patient: Jane Snyder 08/24/2012 9:45 AM   Signature:     Silverio Decamp - PMH-NP 08/24/2012 9:45 AM  Signature:   Liborio Nixon RN 08/24/2012 9:45 AM  Signature: Neill Loft, RN 08/24/2012 9:45 AM  Signature: Quintella Reichert, RN 08/24/2012 9:45 AM  Signature:  Dorien Chihuahua 08/24/2012 9:45 AM  Signature:  Juline Patch, LCSW 08/24/2012 9:45 AM  Signature:  08/24/2012 9:45 AM  Signature:  08/24/2012 9:45 AM  Signature:  08/24/2012 9:45 AM  Signature:    Signature:    Signature:      Scribe for Treatment Team:   Juline Patch,  08/24/2012 9:45 AM

## 2012-08-24 NOTE — Progress Notes (Signed)
Discharge Note:  Patient denied SI and HI.   Denied A/V hallucinations.  Denied pain.  Patient's friend picked up patient to return home.  Patient received all her belongings, clothing, toiletries, wallet, purse, phone, keys, money, prescriptions, medications.  Suicide prevention information was given to patient and discussed with patient, who stated she understood and had no questions.  Patient stated she appreciated all assistance received from [facility] staff.

## 2012-08-24 NOTE — Progress Notes (Signed)
[facility] INPATIENT:  Family/Significant Other Suicide Prevention Education  Suicide Prevention Education:  Education Completed; Serita Kyle, Mother, [phone] has been identified by the patient as the family member/significant other with whom the patient will be residing, and identified as the person(s) who will aid the patient in the event of a mental health crisis (suicidal ideations/suicide attempt).  With written consent from the patient, the family member/significant other has been provided the following suicide prevention education, prior to the and/or following the discharge of the patient.  The suicide prevention education provided includes the following:  Suicide risk factors  Suicide prevention and interventions  National Suicide Hotline telephone number  [facility] assessment telephone number  [facility] Emergency Assistance 911  [facility] and/or Residential Mobile Crisis Unit telephone number  Request made of family/significant other to:  Remove weapons (e.g., guns, rifles, knives), all items previously/currently identified as safety concern.  Mother advised patient does not have access to guns.  Remove drugs/medications (over-the-counter, prescriptions, illicit drugs), all items previously/currently identified as a safety concern.  The family member/significant other verbalizes understanding of the suicide prevention education information provided.  The family member/significant other agrees to remove the items of safety concern listed above.  Wynn Banker 08/24/2012, 11:38 AM

## 2012-08-24 NOTE — Progress Notes (Signed)
[facility] Adult Case Management Discharge Plan :  Will you be returning to the same living situation after discharge: Yes,  Patient is returning to her home. At discharge, do you have transportation home?:Yes,  Patient to arrange transporation home. Do you have the ability to pay for your medications:No.  Patient to be assisted with indigent medications.. Release of information consent forms completed and in the chart;  Patient's signature needed at discharge.  Patient to Follow up at: Follow-up Information   Follow up with [facility] On 08/25/2012. (You are scheduled with [facility] at  1:00 PM on Tuesday, August 25, 2012)    Contact information:   [address]  [place], [place]   [address]  [phone]      Follow up with [facility] On 08/26/2012. (Please go to [facility] walk-in clinic on Wedneday, August 26, 2012  or any weekday between 8AM-3PM for medication management)    Contact information:   [address] [place], [place]   [address] [phone]      Patient denies SI/HI:   Yes, Patient is no longer endorsing SI/HI or thoughts of self harm.  Safety Planning and Suicide Prevention discussed:  Yes,  Reviewed during aftercare groups.  Wynn Banker 08/24/2012, 11:32 AM

## 2012-08-27 NOTE — Progress Notes (Signed)
Patient Discharge Instructions:  After Visit Summary (AVS):   Faxed to:  08/27/12 Discharge Summary Note:   Faxed to:  08/27/12 Psychiatric Admission Assessment Note:   Faxed to:  08/27/12 Suicide Risk Assessment - Discharge Assessment:   Faxed to:  08/27/12 Faxed/Sent to the Next Level Care provider:  08/27/12 Faxed to [facility] @ [phone] Faxed to [facility] @ [phone]  Jerelene Redden, 08/27/2012, 3:42 PM

## 2013-07-01 ENCOUNTER — Emergency Department (INDEPENDENT_AMBULATORY_CARE_PROVIDER_SITE_OTHER)
Admission: EM | Admit: 2013-07-01 | Discharge: 2013-07-01 | Disposition: A | Discharge status: Home / Self Care | Payer: PRIVATE HEALTH INSURANCE | Source: Home / Self Care | Attending: Family Medicine | Admitting: Family Medicine

## 2013-07-01 ENCOUNTER — Encounter (HOSPITAL_COMMUNITY): Payer: Self-pay | Admitting: Emergency Medicine

## 2013-07-01 DIAGNOSIS — J020 Streptococcal pharyngitis: Secondary | ICD-10-CM

## 2013-07-01 LAB — POCT RAPID STREP A: STREPTOCOCCUS, GROUP A SCREEN (DIRECT): NEGATIVE

## 2013-07-01 MED ORDER — METHYLPREDNISOLONE 4 MG PO KIT: PACK | ORAL | Status: DC

## 2013-07-01 MED ORDER — IBUPROFEN 800 MG PO TABS
ORAL_TABLET | ORAL | Status: AC
  Filled 2013-07-01: qty 1

## 2013-07-01 MED ORDER — HYDROCODONE-ACETAMINOPHEN 5-325 MG PO TABS: 1.0000 | ORAL_TABLET | Freq: Four times a day (QID) | ORAL | Status: DC | PRN

## 2013-07-01 MED ORDER — IBUPROFEN 800 MG PO TABS
800.0000 mg | ORAL_TABLET | Freq: Once | ORAL | Status: AC
  Administered 2013-07-01: 800 mg via ORAL

## 2013-07-01 MED ORDER — ONDANSETRON 4 MG PO TBDP: 4.0000 mg | ORAL_TABLET | Freq: Once | ORAL | Status: DC

## 2013-07-01 MED ORDER — AMOXICILLIN 875 MG PO TABS: 875.0000 mg | ORAL_TABLET | Freq: Three times a day (TID) | ORAL | Status: DC

## 2013-07-01 MED ORDER — ONDANSETRON 4 MG PO TBDP
ORAL_TABLET | ORAL | Status: AC
  Filled 2013-07-01: qty 2

## 2013-07-01 MED ORDER — ONDANSETRON 4 MG PO TBDP
8.0000 mg | ORAL_TABLET | Freq: Once | ORAL | Status: AC
  Administered 2013-07-01: 8 mg via ORAL

## 2013-07-01 NOTE — Discharge Instructions (Signed)
Strep Throat  Strep throat is an infection of the throat caused by a bacteria named Streptococcus pyogenes. Your caregiver may call the infection streptococcal "tonsillitis" or "pharyngitis" depending on whether there are signs of inflammation in the tonsils or back of the throat. Strep throat is most common in children aged 41 41 years during the cold months of the year, but it can occur in people of any age during any season. This infection is spread from person to person (contagious) through coughing, sneezing, or other close contact.  SYMPTOMS   · Fever or chills.  · Painful, swollen, red tonsils or throat.  · Pain or difficulty when swallowing.  · White or yellow spots on the tonsils or throat.  · Swollen, tender lymph nodes or "glands" of the neck or under the jaw.  · Red rash all over the body (rare).  DIAGNOSIS   Many different infections can cause the same symptoms. A test must be done to confirm the diagnosis so the right treatment can be given. A "rapid strep test" can help your caregiver make the diagnosis in a few minutes. If this test is not available, a light swab of the infected area can be used for a throat culture test. If a throat culture test is done, results are usually available in a day or two.  TREATMENT   Strep throat is treated with antibiotic medicine.  HOME CARE INSTRUCTIONS   · Gargle with 1 tsp of salt in 1 cup of warm water, 3 4 times per day or as needed for comfort.  · Family members who also have a sore throat or fever should be tested for strep throat and treated with antibiotics if they have the strep infection.  · Make sure everyone in your household washes their hands well.  · Do not share food, drinking cups, or personal items that could cause the infection to spread to others.  · You may need to eat a soft food diet until your sore throat gets better.  · Drink enough water and fluids to keep your urine clear or pale yellow. This will help prevent dehydration.  · Get plenty of  rest.  · Stay home from school, daycare, or work until you have been on antibiotics for 24 hours.  · Only take over-the-counter or prescription medicines for pain, discomfort, or fever as directed by your caregiver.  · If antibiotics are prescribed, take them as directed. Finish them even if you start to feel better.  SEEK MEDICAL CARE IF:   · The glands in your neck continue to enlarge.  · You develop a rash, cough, or earache.  · You cough up green, yellow-brown, or bloody sputum.  · You have pain or discomfort not controlled by medicines.  · Your problems seem to be getting worse rather than better.  SEEK IMMEDIATE MEDICAL CARE IF:   · You develop any new symptoms such as vomiting, severe headache, stiff or painful neck, chest pain, shortness of breath, or trouble swallowing.  · You develop severe throat pain, drooling, or changes in your voice.  · You develop swelling of the neck, or the skin on the neck becomes red and tender.  · You have a fever.  · You develop signs of dehydration, such as fatigue, dry mouth, and decreased urination.  · You become increasingly sleepy, or you cannot wake up completely.  Document Released: 05/10/2000 Document Revised: 04/29/2012 Document Reviewed: 07/12/2010  [employer]® Patient Information ©2014 [employer].

## 2013-07-01 NOTE — ED Notes (Signed)
Pt  Reports   Body   Aches      With symptoms       Starting  Last pm     Pt     Reports  Symptoms of  Fever  As  Well

## 2013-07-01 NOTE — ED Provider Notes (Signed)
CSN: [id number]     Arrival date & time 07/01/13  1018 History   First MD Initiated Contact with Patient 07/01/13 1148     Chief Complaint  Patient presents with  . Fever   (Consider location/radiation/quality/duration/timing/severity/associated sxs/prior Treatment) HPI Comments: 43 year old female presents complaining of sore throat, fever, body aches, headache, nausea. This began last night. She has sick contacts at work specifically what that might have. She has nausea but has not actually vomited. She denies cough, shortness of breath, chest pain. She has not been taking any medicine to help her symptoms.  Patient is a 43 y.o. female presenting with fever.  Fever Associated symptoms: chills, nausea and sore throat   Associated symptoms: no chest pain, no cough, no diarrhea, no dysuria, no ear pain, no myalgias, no rash and no vomiting     Past Medical History  Diagnosis Date  . Asthma   . Depression   . Suicide attempt    Past Surgical History  Procedure Laterality Date  . Breast surgery    . Tubal ligation    . Cosmetic surgery      Abdomen  . Wisdom tooth extraction     History reviewed. No pertinent family history. History  Substance Use Topics  . Smoking status: Never Smoker   . Smokeless tobacco: Never Used  . Alcohol Use: Yes     Comment: seldom   OB History   Grav Para Term Preterm Abortions TAB SAB Ect Mult Living                 Review of Systems  Constitutional: Positive for fever, chills and fatigue.  HENT: Positive for sore throat. Negative for ear pain and sinus pressure.   Eyes: Negative for visual disturbance.  Respiratory: Negative for cough, chest tightness and shortness of breath.   Cardiovascular: Negative for chest pain, palpitations and leg swelling.  Gastrointestinal: Positive for nausea. Negative for vomiting, abdominal pain and diarrhea.  Endocrine: Negative for polydipsia and polyuria.  Genitourinary: Negative for dysuria, urgency and  frequency.  Musculoskeletal: Negative for arthralgias and myalgias.  Skin: Negative for rash.  Neurological: Negative for dizziness, weakness and light-headedness.    Allergies  Review of patient's allergies indicates no known allergies.  Home Medications   Current Outpatient Rx  Name  Route  Sig  Dispense  Refill  . albuterol (PROVENTIL HFA;VENTOLIN HFA) 108 (90 BASE) MCG/ACT inhaler   Inhalation   Inhale 2 puffs into the lungs every 6 (six) hours as needed for wheezing.   1 Inhaler   0   . amitriptyline (ELAVIL) 25 MG tablet   Oral   Take 1 tablet (25 mg total) by mouth at bedtime.   30 tablet   0   . amLODipine (NORVASC) 5 MG tablet   Oral   Take 1 tablet (5 mg total) by mouth daily.   30 tablet   0   . amoxicillin (AMOXIL) 875 MG tablet   Oral   Take 1 tablet (875 mg total) by mouth 3 (three) times daily.   30 tablet   0   . baclofen (LIORESAL) 5 mg TABS   Oral   Take 0.5 tablets (5 mg total) by mouth 3 (three) times daily.   90 tablet   0   . HYDROcodone-acetaminophen (NORCO) 5-325 MG per tablet   Oral   Take 1 tablet by mouth every 6 (six) hours as needed for moderate pain.   30 tablet   0   .  methylPREDNISolone (MEDROL DOSEPAK) 4 MG tablet      follow package directions   21 tablet   0     Dispense as written.   . sertraline (ZOLOFT) 50 MG tablet   Oral   Take 1 tablet (50 mg total) by mouth daily.   30 tablet   0   . traZODone (DESYREL) 50 MG tablet   Oral   Take 1 tablet (50 mg total) by mouth at bedtime as needed for sleep.   30 tablet   0    BP 160/100  Pulse 122  Temp(Src) 102.8 F (39.3 C) (Oral)  Resp 20  SpO2 98% Physical Exam  Nursing note and vitals reviewed. Constitutional: She is oriented to person, place, and time. Vital signs are normal. She appears well-developed and well-nourished. She appears ill. No distress.  HENT:  Head: Normocephalic and atraumatic.  Nose: Nose normal. Right sinus exhibits no maxillary sinus  tenderness and no frontal sinus tenderness. Left sinus exhibits no maxillary sinus tenderness and no frontal sinus tenderness.  Mouth/Throat: Uvula is midline. Oropharyngeal exudate and posterior oropharyngeal erythema present. No posterior oropharyngeal edema or tonsillar abscesses.  Foul breath odor  Neck: Normal range of motion. Neck supple.  Cardiovascular: Regular rhythm and normal heart sounds.  Tachycardia present.  Exam reveals no gallop and no friction rub.   No murmur heard. Pulmonary/Chest: Effort normal and breath sounds normal. No respiratory distress. She has no wheezes. She has no rales.  Lymphadenopathy:    She has cervical adenopathy (tonsillar, bilateral).  Neurological: She is alert and oriented to person, place, and time. She has normal strength. Coordination normal.  Skin: Skin is warm and dry. No rash noted. She is not diaphoretic.  Psychiatric: She has a normal mood and affect. Judgment normal.    ED Course  Procedures (including critical care time) Labs Review Labs Reviewed  POCT RAPID STREP A ([facility] CARE ONLY)   Imaging Review No results found.    MDM   1. Strep pharyngitis    Rapid strep is negative. However, this has the appearance of strep so will treat as such. Treating symptomatically as well. Followup when necessary   Meds ordered this encounter  Medications  . DISCONTD: ondansetron (ZOFRAN-ODT) disintegrating tablet 4 mg    Sig:   . ibuprofen (ADVIL,MOTRIN) tablet 800 mg    Sig:   . ondansetron (ZOFRAN-ODT) disintegrating tablet 8 mg    Sig:   . amoxicillin (AMOXIL) 875 MG tablet    Sig: Take 1 tablet (875 mg total) by mouth 3 (three) times daily.    Dispense:  30 tablet    Refill:  0  . methylPREDNISolone (MEDROL DOSEPAK) 4 MG tablet    Sig: follow package directions    Dispense:  21 tablet    Refill:  0  . HYDROcodone-acetaminophen (NORCO) 5-325 MG per tablet    Sig: Take 1 tablet by mouth every 6 (six) hours as needed for moderate  pain.    Dispense:  30 tablet    Refill:  0     Liam Graham, PA-C 07/01/13 1235

## 2013-07-02 LAB — CULTURE, GROUP A STREP

## 2013-07-02 NOTE — ED Provider Notes (Signed)
Medical screening examination/treatment/procedure(s) were performed by a resident physician or non-physician practitioner and as the supervising physician I was immediately available for consultation/collaboration.  Lynne Leader, MD    Gregor Hams, MD 07/02/13 [phone]

## 2013-07-06 ENCOUNTER — Telehealth (HOSPITAL_COMMUNITY): Payer: Self-pay | Admitting: *Deleted

## 2013-07-06 NOTE — ED Notes (Signed)
Throat culture: Strep beta hemolytic not group A.  I called pt. Pt. verified x 2 and given results.  Pt. Told she was adequately treated with Amoxicillin  And to come back if not better after treatment. If anyone she exposed gets the same symptoms, tell them to get checked as well. Roselyn Meier 07/06/2013

## 2013-07-31 ENCOUNTER — Emergency Department (HOSPITAL_COMMUNITY)
Admission: EM | Admit: 2013-07-31 | Discharge: 2013-07-31 | Disposition: A | Discharge status: Home / Self Care | Payer: PRIVATE HEALTH INSURANCE | Attending: Emergency Medicine | Admitting: Emergency Medicine

## 2013-07-31 ENCOUNTER — Emergency Department (HOSPITAL_COMMUNITY): Payer: PRIVATE HEALTH INSURANCE

## 2013-07-31 ENCOUNTER — Encounter (HOSPITAL_COMMUNITY): Payer: Self-pay | Admitting: Emergency Medicine

## 2013-07-31 DIAGNOSIS — F410 Panic disorder [episodic paroxysmal anxiety] without agoraphobia: Secondary | ICD-10-CM | POA: Diagnosis present

## 2013-07-31 DIAGNOSIS — IMO0002 Reserved for concepts with insufficient information to code with codable children: Secondary | ICD-10-CM | POA: Insufficient documentation

## 2013-07-31 DIAGNOSIS — J45901 Unspecified asthma with (acute) exacerbation: Secondary | ICD-10-CM | POA: Insufficient documentation

## 2013-07-31 DIAGNOSIS — Z792 Long term (current) use of antibiotics: Secondary | ICD-10-CM | POA: Insufficient documentation

## 2013-07-31 DIAGNOSIS — F3289 Other specified depressive episodes: Secondary | ICD-10-CM | POA: Insufficient documentation

## 2013-07-31 DIAGNOSIS — F329 Major depressive disorder, single episode, unspecified: Secondary | ICD-10-CM | POA: Insufficient documentation

## 2013-07-31 DIAGNOSIS — Z79899 Other long term (current) drug therapy: Secondary | ICD-10-CM | POA: Insufficient documentation

## 2013-07-31 DIAGNOSIS — F411 Generalized anxiety disorder: Secondary | ICD-10-CM | POA: Insufficient documentation

## 2013-07-31 HISTORY — DX: Panic disorder (episodic paroxysmal anxiety): F41.0

## 2013-07-31 LAB — CBC WITH DIFFERENTIAL/PLATELET
Basophils Absolute: 0 10*3/uL (ref 0.0–0.1)
Basophils Relative: 0 % (ref 0–1)
EOS PCT: 2 % (ref 0–5)
Eosinophils Absolute: 0.2 10*3/uL (ref 0.0–0.7)
HEMATOCRIT: 34.9 % — AB (ref 36.0–46.0)
HEMOGLOBIN: 11.6 g/dL — AB (ref 12.0–15.0)
LYMPHS ABS: 2.9 10*3/uL (ref 0.7–4.0)
LYMPHS PCT: 33 % (ref 12–46)
MCH: 26.2 pg (ref 26.0–34.0)
MCHC: 33.2 g/dL (ref 30.0–36.0)
MCV: 78.8 fL (ref 78.0–100.0)
MONO ABS: 0.5 10*3/uL (ref 0.1–1.0)
MONOS PCT: 6 % (ref 3–12)
NEUTROS ABS: 5.1 10*3/uL (ref 1.7–7.7)
Neutrophils Relative %: 59 % (ref 43–77)
Platelets: 293 10*3/uL (ref 150–400)
RBC: 4.43 MIL/uL (ref 3.87–5.11)
RDW: 14.3 % (ref 11.5–15.5)
WBC: 8.6 10*3/uL (ref 4.0–10.5)

## 2013-07-31 LAB — BASIC METABOLIC PANEL
BUN: 8 mg/dL (ref 6–23)
CALCIUM: 9.1 mg/dL (ref 8.4–10.5)
CHLORIDE: 98 meq/L (ref 96–112)
CO2: 23 meq/L (ref 19–32)
CREATININE: 0.77 mg/dL (ref 0.50–1.10)
GFR calc Af Amer: >90 mL/min (ref >90)
GFR calc non Af Amer: >90 mL/min (ref >90)
GLUCOSE: 97 mg/dL (ref 70–99)
Potassium: 3.5 mEq/L — ABNORMAL LOW (ref 3.7–5.3)
Sodium: 134 mEq/L — ABNORMAL LOW (ref 137–147)

## 2013-07-31 LAB — I-STAT TROPONIN, ED: Troponin i, poc: 0 ng/mL (ref 0.00–0.08)

## 2013-07-31 MED ORDER — LORAZEPAM 2 MG/ML IJ SOLN
1.0000 mg | Freq: Once | INTRAMUSCULAR | Status: AC
  Administered 2013-07-31: 1 mg via INTRAVENOUS
  Filled 2013-07-31: qty 1

## 2013-07-31 MED ORDER — ALPRAZOLAM 0.25 MG PO TABS: 0.2500 mg | ORAL_TABLET | Freq: Two times a day (BID) | ORAL | Status: DC | PRN

## 2013-07-31 NOTE — ED Provider Notes (Signed)
CSN: [id number]     Arrival date & time 07/31/13  1140 History   First MD Initiated Contact with Patient 07/31/13 1153     Chief Complaint  Patient presents with  . Shortness of Breath  . Anxiety     (Consider location/radiation/quality/duration/timing/severity/associated sxs/prior Treatment) Patient is a 43 y.o. female presenting with shortness of breath and anxiety. The history is provided by the patient.  Shortness of Breath Severity:  Mild Onset quality:  Sudden Duration:  45 minutes Timing:  Constant Progression:  Unchanged Chronicity:  New Context comment:  Upset about friend having surgery Relieved by:  Nothing Worsened by:  Nothing tried Ineffective treatments:  None tried Associated symptoms: no abdominal pain, no chest pain, no cough, no fever, no headaches, no neck pain and no vomiting   Anxiety Associated symptoms include shortness of breath. Pertinent negatives include no chest pain, no abdominal pain and no headaches.    Past Medical History  Diagnosis Date  . Asthma   . Depression   . Suicide attempt    Past Surgical History  Procedure Laterality Date  . Breast surgery    . Tubal ligation    . Cosmetic surgery      Abdomen  . Wisdom tooth extraction     No family history on file. History  Substance Use Topics  . Smoking status: Never Smoker   . Smokeless tobacco: Never Used  . Alcohol Use: Yes     Comment: social   OB History   Grav Para Term Preterm Abortions TAB SAB Ect Mult Living                 Review of Systems  Constitutional: Negative for fever and fatigue.  HENT: Negative for congestion and drooling.   Eyes: Negative for pain.  Respiratory: Positive for chest tightness and shortness of breath. Negative for cough.   Cardiovascular: Negative for chest pain.  Gastrointestinal: Negative for nausea, vomiting, abdominal pain and diarrhea.  Genitourinary: Negative for dysuria and hematuria.  Musculoskeletal: Negative for back pain, gait  problem and neck pain.  Skin: Negative for color change.  Neurological: Negative for dizziness and headaches.  Hematological: Negative for adenopathy.  Psychiatric/Behavioral: Negative for behavioral problems.       Tearful  All other systems reviewed and are negative.      Allergies  Review of patient's allergies indicates no known allergies.  Home Medications   Current Outpatient Rx  Name  Route  Sig  Dispense  Refill  . albuterol (PROVENTIL HFA;VENTOLIN HFA) 108 (90 BASE) MCG/ACT inhaler   Inhalation   Inhale 2 puffs into the lungs every 6 (six) hours as needed for wheezing.   1 Inhaler   0   . amitriptyline (ELAVIL) 25 MG tablet   Oral   Take 1 tablet (25 mg total) by mouth at bedtime.   30 tablet   0   . amLODipine (NORVASC) 5 MG tablet   Oral   Take 1 tablet (5 mg total) by mouth daily.   30 tablet   0   . amoxicillin (AMOXIL) 875 MG tablet   Oral   Take 1 tablet (875 mg total) by mouth 3 (three) times daily.   30 tablet   0   . baclofen (LIORESAL) 5 mg TABS   Oral   Take 0.5 tablets (5 mg total) by mouth 3 (three) times daily.   90 tablet   0   . HYDROcodone-acetaminophen (NORCO) 5-325 MG per tablet  Oral   Take 1 tablet by mouth every 6 (six) hours as needed for moderate pain.   30 tablet   0   . methylPREDNISolone (MEDROL DOSEPAK) 4 MG tablet      follow package directions   21 tablet   0     Dispense as written.   . sertraline (ZOLOFT) 50 MG tablet   Oral   Take 1 tablet (50 mg total) by mouth daily.   30 tablet   0   . traZODone (DESYREL) 50 MG tablet   Oral   Take 1 tablet (50 mg total) by mouth at bedtime as needed for sleep.   30 tablet   0    BP 147/90  Pulse 81  Temp(Src) 98 F (36.7 C) (Oral)  Resp 17  SpO2 100% Physical Exam  Nursing note and vitals reviewed. Constitutional: She is oriented to person, place, and time. She appears well-developed and well-nourished.  HENT:  Head: Normocephalic.  Mouth/Throat:  Oropharynx is clear and moist. No oropharyngeal exudate.  Eyes: Conjunctivae and EOM are normal. Pupils are equal, round, and reactive to light.  Neck: Normal range of motion. Neck supple.  Cardiovascular: Normal rate, regular rhythm, normal heart sounds and intact distal pulses.  Exam reveals no gallop and no friction rub.   No murmur heard. Pulmonary/Chest: Breath sounds normal. She is in respiratory distress (mild tachypnea). She has no wheezes.  Abdominal: Soft. Bowel sounds are normal. There is no tenderness. There is no rebound and no guarding.  Musculoskeletal: Normal range of motion. She exhibits no edema and no tenderness.  Neurological: She is alert and oriented to person, place, and time.  Skin: Skin is warm and dry.  Psychiatric:  Appears anxious. Tearful.     ED Course  Procedures (including critical care time) Labs Review Labs Reviewed  CBC WITH DIFFERENTIAL - Abnormal; Notable for the following:    Hemoglobin 11.6 (*)    HCT 34.9 (*)    All other components within normal limits  BASIC METABOLIC PANEL - Abnormal; Notable for the following:    Sodium 134 (*)    Potassium 3.5 (*)    All other components within normal limits  Randolm Idol, ED   Imaging Review Dg Chest 2 View  07/31/2013   CLINICAL DATA:  Shortness of breath.  EXAM: CHEST  2 VIEW  COMPARISON:  Chest x-ray 03/15/2005.  FINDINGS: Lung volumes are normal. No consolidative airspace disease. No pleural effusions. No pneumothorax. No pulmonary nodule or mass noted. Pulmonary vasculature and the cardiomediastinal silhouette are within normal limits.  IMPRESSION: 1.  No radiographic evidence of acute cardiopulmonary disease.   Electronically Signed   By: Vinnie Langton M.D.   On: 07/31/2013 14:06     EKG Interpretation   Date/Time:  Saturday July 31 2013 11:48:10 EST Ventricular Rate:  77 PR Interval:  120 QRS Duration: 84 QT Interval:  396 QTC Calculation: 448 R Axis:   75 Text Interpretation:   Normal sinus rhythm Nonspecific T wave abnormality  No significant change since last tracing Confirmed by Layloni Fahrner  MD,  Bradshaw Minihan ([id number]) on 07/31/2013 11:54:48 AM      MDM   Final diagnoses:  Anxiety attack    12:08 PM 43 y.o. female who presents with chest tightness and shortness of breath which began approximately 45 minutes ago. She states that she has a history of anxiety and her symptoms are consistent with previous anxiety attacks. She states that she is upset because she has  a friend who went into surgery. She is tearful on exam when this is brought up. Screening labwork already sent. Will give Ativan and continue to monitor.   2:41 PM: I interpreted/reviewed the labs and/or imaging which were non-contributory. Pt now comfortable on exam. Feeling much better. Will provide a few tabs of xanax to use in case of emergency if pt has anxiety attack.  I have discussed the diagnosis/risks/treatment options with the patient and believe the pt to be eligible for discharge home to follow-up with and establish w/ a pcp as needed. We also discussed returning to the ED immediately if new or worsening sx occur. We discussed the sx which are most concerning (e.g., further anxiety issues, sob, cp) that necessitate immediate return. Medications administered to the patient during their visit and any new prescriptions provided to the patient are listed below.  Medications given during this visit Medications  LORazepam (ATIVAN) injection 1 mg (1 mg Intravenous Given 07/31/13 1233)    New Prescriptions   ALPRAZOLAM (XANAX) 0.25 MG TABLET    Take 1 tablet (0.25 mg total) by mouth 2 (two) times daily as needed for anxiety.     Blanchard Kelch, MD 07/31/13 1444

## 2013-07-31 NOTE — Discharge Instructions (Signed)
Panic Attacks Panic attacks are sudden, short-livedsurges of severe anxiety, fear, or discomfort. They [place] occur for no reason when you are relaxed, when you are anxious, or when you are sleeping. Panic attacks [place] occur for a number of reasons:   Healthy people occasionally have panic attacks in extreme, life-threatening situations, such as war or natural disasters. Normal anxiety is a protective mechanism of the body that helps [place] react to danger (fight or flight response).  Panic attacks are often seen with anxiety disorders, such as panic disorder, social anxiety disorder, generalized anxiety disorder, and phobias. Anxiety disorders cause excessive or uncontrollable anxiety. They [place] interfere with your relationships or other life activities.  Panic attacks are sometimes seen with other mental illnesses such as depression and posttraumatic stress disorder.  Certain medical conditions, prescription medicines, and drugs of abuse can cause panic attacks. SYMPTOMS  Panic attacks start suddenly, peak within 20 minutes, and are accompanied by four or more of the following symptoms:  Pounding heart or fast heart rate (palpitations).  Sweating.  Trembling or shaking.  Shortness of breath or feeling smothered.  Feeling choked.  Chest pain or discomfort.  Nausea or strange feeling in your stomach.  Dizziness, lightheadedness, or feeling like you will faint.  Chills or hot flushes.  Numbness or tingling in your lips or hands and feet.  Feeling that things are not real or feeling that you are not yourself.  Fear of losing control or going crazy.  Fear of dying. Some of these symptoms can mimic serious medical conditions. For example, you [place] think you are having a heart attack. Although panic attacks can be very scary, they are not life threatening. DIAGNOSIS  Panic attacks are diagnosed through an assessment by your health care provider. Your health care provider will ask questions  about your symptoms, such as where and when they occurred. Your health care provider will also ask about your medical history and use of alcohol and drugs, including prescription medicines. Your health care provider [place] order blood tests or other studies to rule out a serious medical condition. Your health care provider [place] refer you to a mental health professional for further evaluation. TREATMENT   Most healthy people who have one or two panic attacks in an extreme, life-threatening situation will not require treatment.  The treatment for panic attacks associated with anxiety disorders or other mental illness typically involves counseling with a mental health professional, medicine, or a combination of both. Your health care provider will help determine what treatment is best for you.  Panic attacks due to physical illness usually goes away with treatment of the illness. If prescription medicine is causing panic attacks, talk with your health care provider about stopping the medicine, decreasing the dose, or substituting another medicine.  Panic attacks due to alcohol or drug abuse goes away with abstinence. Some adults need professional help in order to stop drinking or using drugs. HOME CARE INSTRUCTIONS   Take all your medicines as prescribed.   Check with your health care provider before starting new prescription or over-the-counter medicines.  Keep all follow up appointments with your health care provider. SEEK MEDICAL CARE IF:  You are not able to take your medicines as prescribed.  Your symptoms do not improve or get worse. SEEK IMMEDIATE MEDICAL CARE IF:   You experience panic attack symptoms that are different than your usual symptoms.  You have serious thoughts about hurting yourself or others.  You are taking medicine for panic attacks and  have a serious side effect. MAKE SURE YOU:  Understand these instructions.  Will watch your condition.  Will get help right away  if you are not doing well or get worse. Document Released: 05/13/2005 Document Revised: 03/03/2013 Document Reviewed: 12/25/2012 [facility] Patient Information 2014 [place].

## 2013-07-31 NOTE — ED Notes (Signed)
Pt stated that she was seeing a friend upstairs at the hospital who is having surgery. She begin having a panic attack. She felt like she could not breathe, was having diffused chest pain, and dizziness. No cardiac or respiratory distress.

## 2013-07-31 NOTE — ED Notes (Signed)
Pt presents to department for evaluation of SOB and chest pain. States she has history of anxiety, states she had anxiety attack this morning when her friend went into surgery. States diffuse chest pressure at the time. Respirations unlabored. Pt is conscious alert and oriented x4.

## 2013-10-25 ENCOUNTER — Encounter (HOSPITAL_COMMUNITY): Payer: Self-pay | Admitting: Emergency Medicine

## 2013-10-25 ENCOUNTER — Emergency Department (INDEPENDENT_AMBULATORY_CARE_PROVIDER_SITE_OTHER)
Admission: EM | Admit: 2013-10-25 | Discharge: 2013-10-25 | Disposition: A | Discharge status: Home / Self Care | Payer: PRIVATE HEALTH INSURANCE | Source: Home / Self Care | Attending: Family Medicine | Admitting: Family Medicine

## 2013-10-25 DIAGNOSIS — J020 Streptococcal pharyngitis: Secondary | ICD-10-CM

## 2013-10-25 LAB — POCT RAPID STREP A: Streptococcus, Group A Screen (Direct): POSITIVE — AB

## 2013-10-25 MED ORDER — OXYCODONE-ACETAMINOPHEN 5-325 MG PO TABS: 1.0000 | ORAL_TABLET | ORAL | Status: DC | PRN

## 2013-10-25 MED ORDER — AMOXICILLIN 500 MG PO CAPS: 1000.0000 mg | ORAL_CAPSULE | Freq: Three times a day (TID) | ORAL | Status: DC

## 2013-10-25 MED ORDER — DEXAMETHASONE SODIUM PHOSPHATE 10 MG/ML IJ SOLN
INTRAMUSCULAR | Status: AC
  Filled 2013-10-25: qty 1

## 2013-10-25 MED ORDER — DEXAMETHASONE SODIUM PHOSPHATE 10 MG/ML IJ SOLN
10.0000 mg | Freq: Once | INTRAMUSCULAR | Status: AC
  Administered 2013-10-25: 10 mg via INTRAMUSCULAR

## 2013-10-25 MED ORDER — KETOROLAC TROMETHAMINE 60 MG/2ML IM SOLN
60.0000 mg | Freq: Once | INTRAMUSCULAR | Status: AC
  Administered 2013-10-25: 60 mg via INTRAMUSCULAR

## 2013-10-25 MED ORDER — IBUPROFEN 800 MG PO TABS: 800.0000 mg | ORAL_TABLET | Freq: Three times a day (TID) | ORAL | Status: DC | PRN

## 2013-10-25 MED ORDER — KETOROLAC TROMETHAMINE 60 MG/2ML IM SOLN
INTRAMUSCULAR | Status: AC
  Filled 2013-10-25: qty 2

## 2013-10-25 NOTE — Discharge Instructions (Signed)
Strep Throat  Strep throat is an infection of the throat caused by a bacteria named Streptococcus pyogenes. Your caregiver may call the infection streptococcal "tonsillitis" or "pharyngitis" depending on whether there are signs of inflammation in the tonsils or back of the throat. Strep throat is most common in children aged 41 15 years during the cold months of the year, but it can occur in people of any age during any season. This infection is spread from person to person (contagious) through coughing, sneezing, or other close contact.  SYMPTOMS   · Fever or chills.  · Painful, swollen, red tonsils or throat.  · Pain or difficulty when swallowing.  · White or yellow spots on the tonsils or throat.  · Swollen, tender lymph nodes or "glands" of the neck or under the jaw.  · Red rash all over the body (rare).  DIAGNOSIS   Many different infections can cause the same symptoms. A test must be done to confirm the diagnosis so the right treatment can be given. A "rapid strep test" can help your caregiver make the diagnosis in a few minutes. If this test is not available, a light swab of the infected area can be used for a throat culture test. If a throat culture test is done, results are usually available in a day or two.  TREATMENT   Strep throat is treated with antibiotic medicine.  HOME CARE INSTRUCTIONS   · Gargle with 1 tsp of salt in 1 cup of warm water, 3 4 times per day or as needed for comfort.  · Family members who also have a sore throat or fever should be tested for strep throat and treated with antibiotics if they have the strep infection.  · Make sure everyone in your household washes their hands well.  · Do not share food, drinking cups, or personal items that could cause the infection to spread to others.  · You may need to eat a soft food diet until your sore throat gets better.  · Drink enough water and fluids to keep your urine clear or pale yellow. This will help prevent dehydration.  · Get plenty of  rest.  · Stay home from school, daycare, or work until you have been on antibiotics for 24 hours.  · Only take over-the-counter or prescription medicines for pain, discomfort, or fever as directed by your caregiver.  · If antibiotics are prescribed, take them as directed. Finish them even if you start to feel better.  SEEK MEDICAL CARE IF:   · The glands in your neck continue to enlarge.  · You develop a rash, cough, or earache.  · You cough up green, yellow-brown, or bloody sputum.  · You have pain or discomfort not controlled by medicines.  · Your problems seem to be getting worse rather than better.  SEEK IMMEDIATE MEDICAL CARE IF:   · You develop any new symptoms such as vomiting, severe headache, stiff or painful neck, chest pain, shortness of breath, or trouble swallowing.  · You develop severe throat pain, drooling, or changes in your voice.  · You develop swelling of the neck, or the skin on the neck becomes red and tender.  · You have a fever.  · You develop signs of dehydration, such as fatigue, dry mouth, and decreased urination.  · You become increasingly sleepy, or you cannot wake up completely.  Document Released: 05/10/2000 Document Revised: 04/29/2012 Document Reviewed: 07/12/2010  [employer]® Patient Information ©2014 [employer].

## 2013-10-25 NOTE — ED Notes (Signed)
Pt c/o cold sx onset 0400 today Sx include fevers, ST, odynophagia, weakness, chills, BA, abd pain Denies v/n/d Alert w/no signs of acute distress.

## 2013-10-25 NOTE — ED Provider Notes (Signed)
CSN: [id number]     Arrival date & time 10/25/13  1531 History   First MD Initiated Contact with Patient 10/25/13 1700     Chief Complaint  Patient presents with  . URI   (Consider location/radiation/quality/duration/timing/severity/associated sxs/prior Treatment) HPI Comments: 43 year old female presents complaining of sore throat, fever, chills, body aches, headache, neck pain, pain with swallowing, abdominal pain. Her symptoms started this morning and have gotten worse throughout the day. She is taking over-the-counter medication with no relief in her fever were her other symptoms. She has no recent travel or sick contacts.  Patient is a 43 y.o. female presenting with URI.  URI Presenting symptoms: fatigue, fever and sore throat   Presenting symptoms: no cough and no ear pain   Associated symptoms: neck pain     Past Medical History  Diagnosis Date  . Asthma   . Depression   . Suicide attempt    Past Surgical History  Procedure Laterality Date  . Breast surgery    . Tubal ligation    . Cosmetic surgery      Abdomen  . Wisdom tooth extraction     No family history on file. History  Substance Use Topics  . Smoking status: Never Smoker   . Smokeless tobacco: Never Used  . Alcohol Use: Yes     Comment: social   OB History   Grav Para Term Preterm Abortions TAB SAB Ect Mult Living                 Review of Systems  Constitutional: Positive for fever, chills and fatigue.  HENT: Positive for sore throat and trouble swallowing. Negative for ear pain.   Respiratory: Negative for cough and shortness of breath.   Cardiovascular: Negative for chest pain.  Gastrointestinal: Positive for abdominal pain. Negative for nausea, vomiting and diarrhea.  Musculoskeletal: Positive for neck pain and neck stiffness.  All other systems reviewed and are negative.   Allergies  Review of patient's allergies indicates no known allergies.  Home Medications   Prior to Admission  medications   Medication Sig Start Date End Date Taking? Authorizing Provider  albuterol (PROVENTIL HFA;VENTOLIN HFA) 108 (90 BASE) MCG/ACT inhaler Inhale 1-2 puffs into the lungs every 6 (six) hours as needed for wheezing or shortness of breath.    Historical Provider, MD  ALPRAZolam Duanne Moron) 0.25 MG tablet Take 1 tablet (0.25 mg total) by mouth 2 (two) times daily as needed for anxiety. 07/31/13   Blanchard Kelch, MD  amoxicillin (AMOXIL) 500 MG capsule Take 2 capsules (1,000 mg total) by mouth 3 (three) times daily. 10/25/13   Liam Graham, PA-C  Aspirin-Salicylamide-Caffeine (BC HEADACHE POWDER PO) Take 1 packet by mouth daily as needed (for pain).    Historical Provider, MD  ibuprofen (ADVIL,MOTRIN) 800 MG tablet Take 1 tablet (800 mg total) by mouth every 8 (eight) hours as needed for fever or moderate pain. 10/25/13   Liam Graham, PA-C  oxyCODONE-acetaminophen (ROXICET) 5-325 MG per tablet Take 1 tablet by mouth every 4 (four) hours as needed for severe pain. 10/25/13   Freeman Caldron Shaterria Sager, PA-C   BP 116/52  Pulse 98  Temp(Src) 101.4 F (38.6 C) (Oral)  Resp 18  SpO2 100%  LMP 10/12/2013 Physical Exam  Nursing note and vitals reviewed. Constitutional: She is oriented to person, place, and time. Vital signs are normal. She appears well-developed and well-nourished. No distress.  HENT:  Head: Normocephalic and atraumatic.  Nose: Nose normal.  Mouth/Throat:  Uvula is midline and mucous membranes are normal. Posterior oropharyngeal erythema present. No oropharyngeal exudate or posterior oropharyngeal edema.  Neck: Normal range of motion. Neck supple.  Cardiovascular: Normal rate and regular rhythm.   Pulmonary/Chest: Effort normal and breath sounds normal. No respiratory distress.  Lymphadenopathy:    She has cervical adenopathy (tonsillar and posterior cervical ).  Neurological: She is alert and oriented to person, place, and time. She has normal strength. Coordination normal.  Skin:  Skin is warm and dry. No rash noted. She is not diaphoretic.  Psychiatric: She has a normal mood and affect. Judgment normal.    ED Course  Procedures (including critical care time) Labs Review Labs Reviewed  POCT RAPID STREP A ([facility] ONLY) - Abnormal; Notable for the following:    Streptococcus, Group A Screen (Direct) POSITIVE (*)    All other components within normal limits    Imaging Review No results found.   MDM   1. Strep throat    Giving injection of Toradol and Decadron here, will discharge with amoxicillin, ibuprofen, and a few tablets of Percocet for pain. Followup as needed.   Meds ordered this encounter  Medications  . dexamethasone (DECADRON) injection 10 mg    Sig:   . ketorolac (TORADOL) injection 60 mg    Sig:   . amoxicillin (AMOXIL) 500 MG capsule    Sig: Take 2 capsules (1,000 mg total) by mouth 3 (three) times daily.    Dispense:  42 capsule    Refill:  0    Order Specific Question:  Supervising Provider    Answer:  Jake Michaelis, DAVID C [id number]  . oxyCODONE-acetaminophen (ROXICET) 5-325 MG per tablet    Sig: Take 1 tablet by mouth every 4 (four) hours as needed for severe pain.    Dispense:  10 tablet    Refill:  0    Order Specific Question:  Supervising Provider    Answer:  Jake Michaelis, DAVID C [id number]  . ibuprofen (ADVIL,MOTRIN) 800 MG tablet    Sig: Take 1 tablet (800 mg total) by mouth every 8 (eight) hours as needed for fever or moderate pain.    Dispense:  30 tablet    Refill:  0    Order Specific Question:  Supervising Provider    Answer:  Jake Michaelis, DAVID C [place], PA-C 10/25/13 1744

## 2013-10-26 NOTE — ED Provider Notes (Signed)
Medical screening examination/treatment/procedure(s) were performed by resident physician or non-physician practitioner and as supervising physician I was immediately available for consultation/collaboration.   Pauline Good MD.   Billy Fischer, MD 10/26/13 [phone]

## 2014-08-05 ENCOUNTER — Encounter (HOSPITAL_COMMUNITY): Payer: Self-pay | Admitting: Emergency Medicine

## 2014-08-05 DIAGNOSIS — R000 Tachycardia, unspecified: Secondary | ICD-10-CM | POA: Insufficient documentation

## 2014-08-05 DIAGNOSIS — J45901 Unspecified asthma with (acute) exacerbation: Secondary | ICD-10-CM | POA: Insufficient documentation

## 2014-08-05 DIAGNOSIS — Z3202 Encounter for pregnancy test, result negative: Secondary | ICD-10-CM | POA: Insufficient documentation

## 2014-08-05 DIAGNOSIS — B349 Viral infection, unspecified: Secondary | ICD-10-CM | POA: Insufficient documentation

## 2014-08-05 DIAGNOSIS — Z79899 Other long term (current) drug therapy: Secondary | ICD-10-CM | POA: Insufficient documentation

## 2014-08-05 DIAGNOSIS — F329 Major depressive disorder, single episode, unspecified: Secondary | ICD-10-CM | POA: Insufficient documentation

## 2014-08-05 LAB — CBC WITH DIFFERENTIAL/PLATELET
Basophils Absolute: 0 10*3/uL (ref 0.0–0.1)
Basophils Relative: 0 % (ref 0–1)
EOS ABS: 0 10*3/uL (ref 0.0–0.7)
EOS PCT: 0 % (ref 0–5)
HCT: 35.9 % — ABNORMAL LOW (ref 36.0–46.0)
HEMOGLOBIN: 11.6 g/dL — AB (ref 12.0–15.0)
Lymphocytes Relative: 11 % — ABNORMAL LOW (ref 12–46)
Lymphs Abs: 0.8 10*3/uL (ref 0.7–4.0)
MCH: 26.2 pg (ref 26.0–34.0)
MCHC: 32.3 g/dL (ref 30.0–36.0)
MCV: 81 fL (ref 78.0–100.0)
MONO ABS: 0.9 10*3/uL (ref 0.1–1.0)
MONOS PCT: 12 % (ref 3–12)
NEUTROS ABS: 5.6 10*3/uL (ref 1.7–7.7)
NEUTROS PCT: 77 % (ref 43–77)
PLATELETS: 262 10*3/uL (ref 150–400)
RBC: 4.43 MIL/uL (ref 3.87–5.11)
RDW: 14.3 % (ref 11.5–15.5)
WBC: 7.3 10*3/uL (ref 4.0–10.5)

## 2014-08-05 LAB — I-STAT CHEM 8, ED
BUN: 5 mg/dL — AB (ref 6–23)
CREATININE: 0.8 mg/dL (ref 0.50–1.10)
Calcium, Ion: 1.11 mmol/L — ABNORMAL LOW (ref 1.12–1.23)
Chloride: 99 mmol/L (ref 96–112)
GLUCOSE: 128 mg/dL — AB (ref 70–99)
HCT: 38 % (ref 36.0–46.0)
HEMOGLOBIN: 12.9 g/dL (ref 12.0–15.0)
POTASSIUM: 3.5 mmol/L (ref 3.5–5.1)
Sodium: 135 mmol/L (ref 135–145)
TCO2: 21 mmol/L (ref 0–100)

## 2014-08-05 MED ORDER — ACETAMINOPHEN 325 MG PO TABS
650.0000 mg | ORAL_TABLET | Freq: Once | ORAL | Status: AC
  Administered 2014-08-05: 650 mg via ORAL

## 2014-08-05 NOTE — ED Notes (Signed)
Pt st's she has had generalized body aches x;s 5 days.  St's today started running a elevated temp.  Pt took Tylenol at 5pm

## 2014-08-06 ENCOUNTER — Emergency Department (HOSPITAL_COMMUNITY)
Admission: EM | Admit: 2014-08-06 | Discharge: 2014-08-06 | Disposition: A | Discharge status: Home / Self Care | Payer: PRIVATE HEALTH INSURANCE | Attending: Emergency Medicine | Admitting: Emergency Medicine

## 2014-08-06 ENCOUNTER — Emergency Department (HOSPITAL_COMMUNITY): Payer: PRIVATE HEALTH INSURANCE

## 2014-08-06 DIAGNOSIS — R509 Fever, unspecified: Secondary | ICD-10-CM

## 2014-08-06 DIAGNOSIS — B349 Viral infection, unspecified: Secondary | ICD-10-CM

## 2014-08-06 LAB — URINALYSIS, ROUTINE W REFLEX MICROSCOPIC
Bilirubin Urine: NEGATIVE
GLUCOSE, UA: NEGATIVE mg/dL
HGB URINE DIPSTICK: NEGATIVE
KETONES UR: NEGATIVE mg/dL
NITRITE: NEGATIVE
PH: 6 (ref 5.0–8.0)
Protein, ur: NEGATIVE mg/dL
SPECIFIC GRAVITY, URINE: 1.009 (ref 1.005–1.030)
Urobilinogen, UA: 0.2 mg/dL (ref 0.0–1.0)

## 2014-08-06 LAB — URINE MICROSCOPIC-ADD ON

## 2014-08-06 LAB — POC URINE PREG, ED: PREG TEST UR: NEGATIVE

## 2014-08-06 MED ORDER — METOCLOPRAMIDE HCL 5 MG/ML IJ SOLN
10.0000 mg | Freq: Once | INTRAMUSCULAR | Status: AC
  Administered 2014-08-06: 10 mg via INTRAVENOUS
  Filled 2014-08-06: qty 2

## 2014-08-06 MED ORDER — KETOROLAC TROMETHAMINE 30 MG/ML IJ SOLN
30.0000 mg | Freq: Once | INTRAMUSCULAR | Status: AC
  Administered 2014-08-06: 30 mg via INTRAVENOUS
  Filled 2014-08-06: qty 1

## 2014-08-06 MED ORDER — SODIUM CHLORIDE 0.9 % IV BOLUS (SEPSIS)
1000.0000 mL | Freq: Once | INTRAVENOUS | Status: AC
  Administered 2014-08-06: 1000 mL via INTRAVENOUS

## 2014-08-06 MED ORDER — IBUPROFEN 800 MG PO TABS: 800.0000 mg | ORAL_TABLET | Freq: Three times a day (TID) | ORAL | Status: DC

## 2014-08-06 MED ORDER — DIPHENHYDRAMINE HCL 50 MG/ML IJ SOLN
25.0000 mg | Freq: Once | INTRAMUSCULAR | Status: AC
  Administered 2014-08-06: 25 mg via INTRAVENOUS
  Filled 2014-08-06: qty 1

## 2014-08-06 MED ORDER — CYCLOBENZAPRINE HCL 10 MG PO TABS
5.0000 mg | ORAL_TABLET | Freq: Once | ORAL | Status: AC
  Administered 2014-08-06: 5 mg via ORAL
  Filled 2014-08-06: qty 1

## 2014-08-06 NOTE — ED Notes (Signed)
Pt. Left with all belongings 

## 2014-08-06 NOTE — ED Provider Notes (Signed)
CSN: [id number]     Arrival date & time 08/05/14  2115 History  This chart was scribed for Wandra Arthurs, MD by Eustaquio Maize, ED Scribe. This patient was seen in room B14C/B14C and the patient's care was started at 12:21 AM.    Chief Complaint  Patient presents with  . Generalized Body Aches   The history is provided by the patient. No language interpreter was used.    HPI Comments: Jane Snyder is a 44 y.o. female who presents to the Emergency Department complaining of a headache and generalized body aches that began approximately 5 days ago. She states that it began with allergy like symptoms. Pt also complains of shortness of breath, dry cough. She has fever since yesterday. She denies nausea, vomiting, diarrhea, dysuria, or any other symptoms. Pt also denies any recent sick contact with similar symptoms.     Past Medical History  Diagnosis Date  . Asthma   . Depression   . Suicide attempt    Past Surgical History  Procedure Laterality Date  . Breast surgery    . Tubal ligation    . Cosmetic surgery      Abdomen  . Wisdom tooth extraction     No family history on file. History  Substance Use Topics  . Smoking status: Never Smoker   . Smokeless tobacco: Never Used  . Alcohol Use: Yes     Comment: social   OB History    No data available     Review of Systems  Constitutional: Positive for fever.  Respiratory: Positive for cough and shortness of breath.   Gastrointestinal: Negative for nausea, vomiting and diarrhea.  Genitourinary: Negative for dysuria.  Musculoskeletal: Positive for myalgias.  Neurological: Positive for headaches.  All other systems reviewed and are negative.     Allergies  Review of patient's allergies indicates no known allergies.  Home Medications   Prior to Admission medications   Medication Sig Start Date End Date Taking? Authorizing Provider  acetaminophen (TYLENOL) 325 MG tablet Take 650 mg by mouth every 6 (six) hours as  needed for mild pain.   Yes Historical Provider, MD  albuterol (PROVENTIL HFA;VENTOLIN HFA) 108 (90 BASE) MCG/ACT inhaler Inhale 1-2 puffs into the lungs every 6 (six) hours as needed for wheezing or shortness of breath.   Yes Historical Provider, MD  ALPRAZolam (XANAX) 0.25 MG tablet Take 1 tablet (0.25 mg total) by mouth 2 (two) times daily as needed for anxiety. Patient not taking: Reported on 08/06/2014 07/31/13   Pamella Pert, MD  amoxicillin (AMOXIL) 500 MG capsule Take 2 capsules (1,000 mg total) by mouth 3 (three) times daily. Patient not taking: Reported on 08/06/2014 10/25/13   Liam Graham, PA-C  Aspirin-Salicylamide-Caffeine (BC HEADACHE POWDER PO) Take 1 packet by mouth daily as needed (for pain).    Historical Provider, MD  ibuprofen (ADVIL,MOTRIN) 800 MG tablet Take 1 tablet (800 mg total) by mouth every 8 (eight) hours as needed for fever or moderate pain. Patient not taking: Reported on 08/06/2014 10/25/13   Liam Graham, PA-C  oxyCODONE-acetaminophen (ROXICET) 5-325 MG per tablet Take 1 tablet by mouth every 4 (four) hours as needed for severe pain. Patient not taking: Reported on 08/06/2014 10/25/13   Liam Graham, PA-C   Triage Vitals: BP 157/90 mmHg  Pulse 107  Temp(Src) 102.6 F (39.2 C) (Oral)  Resp 24  Ht 5\' 3"  (1.6 m)  Wt 200 lb (90.719 kg)  BMI 35.44 kg/m2  SpO2 96%  LMP 07/24/2014   Physical Exam  Constitutional: She is oriented to person, place, and time. She appears well-developed and well-nourished. No distress.  HENT:  Head: Normocephalic and atraumatic.  Eyes: Conjunctivae and EOM are normal.  Neck: Neck supple. No tracheal deviation present.  No meningeal signs.   Cardiovascular: Tachycardia present.   Pulmonary/Chest: Effort normal. No respiratory distress. She has decreased breath sounds (Bilaterally. ). She has no wheezes. She has no rales.  Musculoskeletal: Normal range of motion. She exhibits tenderness (Diffuse muscle tenderness. No bony  tenderness. ).  Neurological: She is alert and oriented to person, place, and time.  Skin: Skin is warm and dry.  Psychiatric: She has a normal mood and affect. Her behavior is normal.  Nursing note and vitals reviewed.   ED Course  Procedures (including critical care time)  DIAGNOSTIC STUDIES: Oxygen Saturation is 96% on RA, normal by my interpretation.    COORDINATION OF CARE: 12:21 AM-Discussed treatment plan which includes CXR, UA with pt at bedside and pt agreed to plan.   Labs Review Labs Reviewed  CBC WITH DIFFERENTIAL/PLATELET - Abnormal; Notable for the following:    Hemoglobin 11.6 (*)    HCT 35.9 (*)    Lymphocytes Relative 11 (*)    All other components within normal limits  URINALYSIS, ROUTINE W REFLEX MICROSCOPIC - Abnormal; Notable for the following:    Leukocytes, UA TRACE (*)    All other components within normal limits  URINE MICROSCOPIC-ADD ON - Abnormal; Notable for the following:    Squamous Epithelial / LPF FEW (*)    All other components within normal limits  I-STAT CHEM 8, ED - Abnormal; Notable for the following:    BUN 5 (*)    Glucose, Bld 128 (*)    Calcium, Ion 1.11 (*)    All other components within normal limits  POC URINE PREG, ED    Imaging Review Dg Chest 2 View  08/06/2014   CLINICAL DATA:  Cough and fever.  Shortness of breath for 1 day.  EXAM: CHEST  2 VIEW  COMPARISON:  07/31/2013  FINDINGS: The cardiomediastinal contours are normal. The lungs are clear. Pulmonary vasculature is normal. No consolidation, pleural effusion, or pneumothorax. No acute osseous abnormalities are seen.  IMPRESSION: No acute pulmonary process.   Electronically Signed   By: Jeb Levering M.D.   On: 08/06/2014 02:22     EKG Interpretation   Date/Time:  Friday August 05 2014 21:25:44 EST Ventricular Rate:  107 PR Interval:  118 QRS Duration: 84 QT Interval:  328 QTC Calculation: 437 R Axis:   110 Text Interpretation:  Sinus tachycardia with Fusion  complexes Left  posterior fascicular block ST \\T \ T wave abnormality, consider inferior  ischemia ST \\T \ T wave abnormality, consider anterolateral ischemia  Abnormal ECG tachycardia new since previous Confirmed by [name] Mcinnis  MD, Ara Mano  ([id number]) on 08/06/2014 12:16:14 AM      MDM   Final diagnoses:  Fever   Jane Snyder is a 44 y.o. female here with body aches, fever. Likely viral syndrome. Outside window for tamiflu. Will get labs, CXR and UA and hydrate and reassess.   2:30 AM Labs unremarkable. CXR and UA unremarkable. Tachycardia improved with fluids. Likely viral syndrome. Will dc home.   I personally performed the services described in this documentation, which was scribed in my presence. The recorded information has been reviewed and is accurate.     Wandra Arthurs, MD 08/06/14 0230

## 2014-08-06 NOTE — Discharge Instructions (Signed)
Take motrin every 6 hrs for 2 days then as needed.   Take tylenol for fever.   Stay hydrated.   Follow up with your doctor.   Return to ER if you have severe pain, vomiting, fever.

## 2014-12-06 ENCOUNTER — Emergency Department (HOSPITAL_COMMUNITY)
Admission: EM | Admit: 2014-12-06 | Discharge: 2014-12-07 | Disposition: A | Discharge status: Other Acute Inpatient Hospital | Payer: PRIVATE HEALTH INSURANCE | Attending: Emergency Medicine | Admitting: Emergency Medicine

## 2014-12-06 ENCOUNTER — Encounter (HOSPITAL_COMMUNITY): Payer: Self-pay | Admitting: *Deleted

## 2014-12-06 ENCOUNTER — Ambulatory Visit (HOSPITAL_COMMUNITY)
Admission: RE | Admit: 2014-12-06 | Discharge: 2014-12-06 | Disposition: A | Discharge status: Home / Self Care | Payer: PRIVATE HEALTH INSURANCE | Attending: Psychiatry | Admitting: Psychiatry

## 2014-12-06 DIAGNOSIS — T1491 Suicide attempt: Secondary | ICD-10-CM | POA: Diagnosis not present

## 2014-12-06 DIAGNOSIS — R51 Headache: Secondary | ICD-10-CM | POA: Insufficient documentation

## 2014-12-06 DIAGNOSIS — T1491XA Suicide attempt, initial encounter: Secondary | ICD-10-CM

## 2014-12-06 DIAGNOSIS — T510X2A Toxic effect of ethanol, intentional self-harm, initial encounter: Secondary | ICD-10-CM | POA: Diagnosis not present

## 2014-12-06 DIAGNOSIS — F332 Major depressive disorder, recurrent severe without psychotic features: Secondary | ICD-10-CM | POA: Diagnosis present

## 2014-12-06 DIAGNOSIS — Z3202 Encounter for pregnancy test, result negative: Secondary | ICD-10-CM | POA: Diagnosis not present

## 2014-12-06 DIAGNOSIS — Z79899 Other long term (current) drug therapy: Secondary | ICD-10-CM | POA: Insufficient documentation

## 2014-12-06 DIAGNOSIS — F329 Major depressive disorder, single episode, unspecified: Secondary | ICD-10-CM | POA: Insufficient documentation

## 2014-12-06 DIAGNOSIS — R45851 Suicidal ideations: Secondary | ICD-10-CM | POA: Diagnosis not present

## 2014-12-06 DIAGNOSIS — Y999 Unspecified external cause status: Secondary | ICD-10-CM | POA: Insufficient documentation

## 2014-12-06 DIAGNOSIS — Y939 Activity, unspecified: Secondary | ICD-10-CM | POA: Diagnosis not present

## 2014-12-06 DIAGNOSIS — Y929 Unspecified place or not applicable: Secondary | ICD-10-CM | POA: Insufficient documentation

## 2014-12-06 DIAGNOSIS — F4310 Post-traumatic stress disorder, unspecified: Secondary | ICD-10-CM | POA: Insufficient documentation

## 2014-12-06 DIAGNOSIS — T5192XA Toxic effect of unspecified alcohol, intentional self-harm, initial encounter: Secondary | ICD-10-CM | POA: Insufficient documentation

## 2014-12-06 DIAGNOSIS — F1010 Alcohol abuse, uncomplicated: Secondary | ICD-10-CM | POA: Insufficient documentation

## 2014-12-06 DIAGNOSIS — J45909 Unspecified asthma, uncomplicated: Secondary | ICD-10-CM | POA: Diagnosis not present

## 2014-12-06 DIAGNOSIS — Z008 Encounter for other general examination: Secondary | ICD-10-CM | POA: Diagnosis present

## 2014-12-06 LAB — COMPREHENSIVE METABOLIC PANEL
ALBUMIN: 3.7 g/dL (ref 3.5–5.0)
ALK PHOS: 78 U/L (ref 38–126)
ALT: 9 U/L — AB (ref 14–54)
AST: 17 U/L (ref 15–41)
Anion gap: 9 (ref 5–15)
BILIRUBIN TOTAL: 0.4 mg/dL (ref 0.3–1.2)
BUN: 13 mg/dL (ref 6–20)
CHLORIDE: 104 mmol/L (ref 101–111)
CO2: 25 mmol/L (ref 22–32)
CREATININE: 1.02 mg/dL — AB (ref 0.44–1.00)
Calcium: 9.4 mg/dL (ref 8.9–10.3)
GFR calc non Af Amer: >60 mL/min (ref >60)
GLUCOSE: 99 mg/dL (ref 65–99)
POTASSIUM: 4 mmol/L (ref 3.5–5.1)
SODIUM: 138 mmol/L (ref 135–145)
TOTAL PROTEIN: 8.7 g/dL — AB (ref 6.5–8.1)

## 2014-12-06 LAB — RAPID URINE DRUG SCREEN, HOSP PERFORMED
Amphetamines: NOT DETECTED
BENZODIAZEPINES: NOT DETECTED
Barbiturates: NOT DETECTED
Cocaine: NOT DETECTED
Opiates: NOT DETECTED
TETRAHYDROCANNABINOL: NOT DETECTED

## 2014-12-06 LAB — ACETAMINOPHEN LEVEL

## 2014-12-06 LAB — CBC
HCT: 39.4 % (ref 36.0–46.0)
HEMOGLOBIN: 13 g/dL (ref 12.0–15.0)
MCH: 27 pg (ref 26.0–34.0)
MCHC: 33 g/dL (ref 30.0–36.0)
MCV: 81.9 fL (ref 78.0–100.0)
Platelets: 335 10*3/uL (ref 150–400)
RBC: 4.81 MIL/uL (ref 3.87–5.11)
RDW: 14.5 % (ref 11.5–15.5)
WBC: 12.2 10*3/uL — ABNORMAL HIGH (ref 4.0–10.5)

## 2014-12-06 LAB — PREGNANCY, URINE: Preg Test, Ur: NEGATIVE

## 2014-12-06 LAB — ETHANOL: Alcohol, Ethyl (B): <5 mg/dL (ref <5)

## 2014-12-06 LAB — SALICYLATE LEVEL

## 2014-12-06 MED ORDER — ONDANSETRON HCL 4 MG PO TABS: 4.0000 mg | ORAL_TABLET | Freq: Three times a day (TID) | ORAL | Status: DC | PRN

## 2014-12-06 MED ORDER — IBUPROFEN 200 MG PO TABS
600.0000 mg | ORAL_TABLET | Freq: Three times a day (TID) | ORAL | Status: DC | PRN
  Administered 2014-12-07: 600 mg via ORAL
  Filled 2014-12-06: qty 3

## 2014-12-06 MED ORDER — ONDANSETRON 4 MG PO TBDP
4.0000 mg | ORAL_TABLET | Freq: Once | ORAL | Status: AC
  Administered 2014-12-06: 4 mg via ORAL
  Filled 2014-12-06: qty 1

## 2014-12-06 MED ORDER — ALBUTEROL SULFATE HFA 108 (90 BASE) MCG/ACT IN AERS: 1.0000 | INHALATION_SPRAY | Freq: Four times a day (QID) | RESPIRATORY_TRACT | Status: DC | PRN

## 2014-12-06 MED ORDER — LORAZEPAM 0.5 MG PO TABS
1.0000 mg | ORAL_TABLET | Freq: Three times a day (TID) | ORAL | Status: DC | PRN
  Administered 2014-12-07: 1 mg via ORAL
  Filled 2014-12-06: qty 1

## 2014-12-06 MED ORDER — ACETAMINOPHEN 325 MG PO TABS
650.0000 mg | ORAL_TABLET | ORAL | Status: DC | PRN
  Administered 2014-12-07: 650 mg via ORAL
  Filled 2014-12-06: qty 2

## 2014-12-06 MED ORDER — ALUM & MAG HYDROXIDE-SIMETH 200-200-20 MG/5ML PO SUSP: 30.0000 mL | ORAL | Status: DC | PRN

## 2014-12-06 NOTE — ED Notes (Signed)
Pt states last night she had a suicide attempt, states she tried to overdose on alcohol, states she drank a "tall bottle", unsure of amount, states it was liquor, pt states she is SI, denies HI, pt calm at this time.

## 2014-12-06 NOTE — ED Provider Notes (Signed)
CSN: [id number]     Arrival date & time 12/06/14  2034 History   First MD Initiated Contact with Patient 12/06/14 2127     Chief Complaint  Patient presents with  . Medical Clearance     (Consider location/radiation/quality/duration/timing/severity/associated sxs/prior Treatment) The history is provided by the patient.   patient presents for medical clearance. Seen a behavioral health [facility] and transferred over here because they have no available beds. She is been more depressed and suicidal. States she has a lot of things going on and is not one specific thing that made her worse. States that she drank a whole bottle of rum last night in attempt to kill herself. She states she has a headache and some nausea and she vomited last night. Still is depressed. No other ingestion. Previous history of depressions in a suicide attempt. Denies other substance abuse.  Past Medical History  Diagnosis Date  . Asthma   . Depression   . Suicide attempt    Past Surgical History  Procedure Laterality Date  . Breast surgery    . Tubal ligation    . Cosmetic surgery      Abdomen  . Wisdom tooth extraction     No family history on file. History  Substance Use Topics  . Smoking status: Never Smoker   . Smokeless tobacco: Never Used  . Alcohol Use: Yes     Comment: social   OB History    No data available     Review of Systems  Constitutional: Negative for activity change and appetite change.  Eyes: Negative for pain.  Respiratory: Negative for chest tightness and shortness of breath.   Cardiovascular: Negative for chest pain and leg swelling.  Gastrointestinal: Positive for nausea and vomiting. Negative for abdominal pain and diarrhea.  Genitourinary: Negative for flank pain.  Musculoskeletal: Negative for back pain and neck stiffness.  Skin: Negative for rash.  Neurological: Positive for headaches. Negative for weakness and numbness.  Psychiatric/Behavioral: Positive for suicidal ideas  and dysphoric mood. Negative for behavioral problems.      Allergies  Review of patient's allergies indicates no known allergies.  Home Medications   Prior to Admission medications   Medication Sig Start Date End Date Taking? Authorizing Provider  acetaminophen (TYLENOL) 325 MG tablet Take 650 mg by mouth every 6 (six) hours as needed for mild pain.   Yes Historical Provider, MD  albuterol (PROVENTIL HFA;VENTOLIN HFA) 108 (90 BASE) MCG/ACT inhaler Inhale 1-2 puffs into the lungs every 6 (six) hours as needed for wheezing or shortness of breath.   Yes Historical Provider, MD  Aspirin-Salicylamide-Caffeine (BC HEADACHE POWDER PO) Take 1 packet by mouth daily as needed (for pain).   Yes Historical Provider, MD  ibuprofen (ADVIL,MOTRIN) 800 MG tablet Take 1 tablet (800 mg total) by mouth 3 (three) times daily. Patient not taking: Reported on 12/06/2014 08/06/14   Wandra Arthurs, MD   BP 157/74 mmHg  Pulse 54  Temp(Src) 98.2 F (36.8 C) (Oral)  Resp 16  SpO2 100%  LMP 11/22/2014 Physical Exam  Constitutional: She is oriented to person, place, and time. She appears well-developed and well-nourished.  HENT:  Head: Normocephalic and atraumatic.  Eyes: Pupils are equal, round, and reactive to light.  Neck: Normal range of motion.  Cardiovascular: Normal rate, regular rhythm and normal heart sounds.   No murmur heard. Pulmonary/Chest: Effort normal and breath sounds normal. No respiratory distress. She has no wheezes.  Abdominal: Soft. Bowel sounds are normal. She  exhibits no distension. There is no tenderness.  Musculoskeletal: Normal range of motion.  Neurological: She is alert and oriented to person, place, and time. No cranial nerve deficit.  Skin: Skin is warm and dry.  Psychiatric: Her speech is normal.  Patient appears somewhat depressed.  Nursing note and vitals reviewed.   ED Course  Procedures (including critical care time) Labs Review Labs Reviewed  COMPREHENSIVE METABOLIC  PANEL - Abnormal; Notable for the following:    Creatinine, Ser 1.02 (*)    Total Protein 8.7 (*)    ALT 9 (*)    All other components within normal limits  ACETAMINOPHEN LEVEL - Abnormal; Notable for the following:    Acetaminophen (Tylenol), Serum <10 (*)    All other components within normal limits  CBC - Abnormal; Notable for the following:    WBC 12.2 (*)    All other components within normal limits  ETHANOL  SALICYLATE LEVEL  URINE RAPID DRUG SCREEN, HOSP PERFORMED  PREGNANCY, URINE    Imaging Review No results found.   EKG Interpretation None      MDM   Final diagnoses:  Suicide attempt    Patient with a reported suicide attempt by drinking rum. Appears to medically cleared. Likely will get behavioral health but tomorrow.    Davonna Belling, MD 12/07/14 [phone]
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
## 2014-12-06 NOTE — ED Notes (Signed)
Pt given gingerale and crackers 

## 2014-12-07 ENCOUNTER — Inpatient Hospital Stay (HOSPITAL_COMMUNITY)
Admission: AD | Admit: 2014-12-07 | Discharge: 2014-12-13 | DRG: 885 | Disposition: A | Discharge status: Home / Self Care | Payer: Federal, State, Local not specified - Other | Source: Intra-hospital | Attending: Psychiatry | Admitting: Psychiatry

## 2014-12-07 DIAGNOSIS — T1491 Suicide attempt: Secondary | ICD-10-CM | POA: Diagnosis not present

## 2014-12-07 DIAGNOSIS — R45851 Suicidal ideations: Secondary | ICD-10-CM | POA: Diagnosis present

## 2014-12-07 DIAGNOSIS — T5192XA Toxic effect of unspecified alcohol, intentional self-harm, initial encounter: Secondary | ICD-10-CM | POA: Diagnosis not present

## 2014-12-07 DIAGNOSIS — F4312 Post-traumatic stress disorder, chronic: Secondary | ICD-10-CM | POA: Diagnosis present

## 2014-12-07 DIAGNOSIS — G4700 Insomnia, unspecified: Secondary | ICD-10-CM | POA: Diagnosis present

## 2014-12-07 DIAGNOSIS — T1491XA Suicide attempt, initial encounter: Secondary | ICD-10-CM | POA: Insufficient documentation

## 2014-12-07 DIAGNOSIS — F332 Major depressive disorder, recurrent severe without psychotic features: Secondary | ICD-10-CM | POA: Diagnosis present

## 2014-12-07 DIAGNOSIS — I10 Essential (primary) hypertension: Secondary | ICD-10-CM | POA: Diagnosis present

## 2014-12-07 DIAGNOSIS — T510X2A Toxic effect of ethanol, intentional self-harm, initial encounter: Secondary | ICD-10-CM

## 2014-12-07 DIAGNOSIS — J45909 Unspecified asthma, uncomplicated: Secondary | ICD-10-CM | POA: Diagnosis present

## 2014-12-07 HISTORY — DX: Suicidal ideations: R45.851

## 2014-12-07 MED ORDER — WHITE PETROLATUM GEL
Status: AC
  Filled 2014-12-07: qty 5

## 2014-12-07 MED ORDER — FLUOXETINE HCL 10 MG PO CAPS
10.0000 mg | ORAL_CAPSULE | Freq: Every day | ORAL | Status: DC
  Administered 2014-12-08: 10 mg via ORAL
  Filled 2014-12-07 (×3): qty 1

## 2014-12-07 MED ORDER — IBUPROFEN 600 MG PO TABS
600.0000 mg | ORAL_TABLET | Freq: Three times a day (TID) | ORAL | Status: DC | PRN
  Administered 2014-12-09: 600 mg via ORAL
  Filled 2014-12-07: qty 1

## 2014-12-07 MED ORDER — PRAZOSIN HCL 2 MG PO CAPS
2.0000 mg | ORAL_CAPSULE | Freq: Every day | ORAL | Status: DC
  Filled 2014-12-07: qty 1

## 2014-12-07 MED ORDER — PRAZOSIN HCL 2 MG PO CAPS
2.0000 mg | ORAL_CAPSULE | Freq: Every day | ORAL | Status: DC
  Administered 2014-12-07: 2 mg via ORAL
  Filled 2014-12-07 (×3): qty 1
  Filled 2014-12-07: qty 2

## 2014-12-07 MED ORDER — ALBUTEROL SULFATE HFA 108 (90 BASE) MCG/ACT IN AERS: 1.0000 | INHALATION_SPRAY | Freq: Four times a day (QID) | RESPIRATORY_TRACT | Status: DC | PRN

## 2014-12-07 MED ORDER — FLUOXETINE HCL 10 MG PO CAPS
10.0000 mg | ORAL_CAPSULE | Freq: Every day | ORAL | Status: DC
  Administered 2014-12-07: 10 mg via ORAL
  Filled 2014-12-07 (×2): qty 1

## 2014-12-07 MED ORDER — LORAZEPAM 1 MG PO TABS
1.0000 mg | ORAL_TABLET | Freq: Three times a day (TID) | ORAL | Status: DC | PRN
  Administered 2014-12-08: 1 mg via ORAL
  Filled 2014-12-07: qty 1

## 2014-12-07 MED ORDER — ALUM & MAG HYDROXIDE-SIMETH 200-200-20 MG/5ML PO SUSP: 30.0000 mL | ORAL | Status: DC | PRN

## 2014-12-07 MED ORDER — ACETAMINOPHEN 325 MG PO TABS
650.0000 mg | ORAL_TABLET | ORAL | Status: DC | PRN
  Administered 2014-12-08 – 2014-12-12 (×4): 650 mg via ORAL
  Filled 2014-12-07 (×4): qty 2

## 2014-12-07 MED ORDER — ONDANSETRON HCL 4 MG PO TABS: 4.0000 mg | ORAL_TABLET | Freq: Three times a day (TID) | ORAL | Status: DC | PRN
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
## 2014-12-07 NOTE — Progress Notes (Signed)
[facility] Notes:  (Nursing/MHT/Case Management/Adjunct)  Date:  12/07/2014  Time:  10:40 PM  Summary of Progress/Problems: Irasema was sleep during wrap up group tonight.  Jeanette Caprice 12/07/2014, 10:40 PM

## 2014-12-07 NOTE — ED Notes (Signed)
Pt is alert and oriented with si thoughts to ED. Pt says that her stressors include relationship problems with her boyfriend where she wants more of the relationship than he does. Pt rates depression and hopelessness as a 10 on 1-10 scale with 10 being the most. Pt denies hi and hallucinations. Safety maintained in the [facility].

## 2014-12-07 NOTE — BH Assessment (Signed)
Sent referrals to the following appropriate MH/ SA facilities.   [place], [place], [place], [place], [place] Triage Specialist 12/07/2014 2:12 AM

## 2014-12-07 NOTE — ED Notes (Signed)
Bed: WA33 Expected date:  Expected time:  Means of arrival:  Comments: [facility] 

## 2014-12-07 NOTE — Tx Team (Signed)
Initial Interdisciplinary Treatment Plan   PATIENT STRESSORS: Marital or family conflict   PATIENT STRENGTHS: Physical Health   PROBLEM LIST: Problem List/Patient Goals Date to be addressed Date deferred Reason deferred Estimated date of resolution  Suicidal ideation 12/07/14   DC  depression2                                                 DISCHARGE CRITERIA:  Improved stabilization in mood, thinking, and/or behavior Reduction of life-threatening or endangering symptoms to within safe limits  PRELIMINARY DISCHARGE PLAN: Outpatient therapy Return to previous living arrangement Return to previous work or school arrangements  PATIENT/FAMIILY INVOLVEMENT: This treatment plan has been presented to and reviewed with the patient, NATISHA TRZCINSKI, and/or family member,pt.  The patient and family have been given the opportunity to ask questions and make suggestions.  Oretha Milch 12/07/2014, 6:08 PM

## 2014-12-07 NOTE — BH Assessment (Signed)
[facility] Assessment Progress Note  Per Corena Pilgrim, MD, this pt requires psychiatric hospitalization at this time.  Debarah Crape, RN, [facility] has assigned pt to [facility] Rm 403-2.  Pt has signed Voluntary Admission and Consent for Treatment, as well as Consent to Release Information, and signed forms have been faxed to [facility].  Pt's nurse, Jan, has been notified, and agrees to send original paperwork along with pt via [facility], and to call report to [phone].  Jalene Mullet, [place] Triage Specialist [phone]

## 2014-12-07 NOTE — ED Notes (Signed)
Patient slept through transfer to TCU.  Awakened briefly to introduce myself and assess needs.  No signs of acute distress noted.  Call light in reach.  Sitter present.

## 2014-12-07 NOTE — Progress Notes (Signed)
D:  Jane Snyder reports that she is very depressed and has a lot of anxiety.  She has been in bed all evening, only getting up to make a phone call.  She denies any current SI/HI/AVH, but says suicidal thoughts "come and go".  She does agree to speak with staff if it becomes overwhelming.  Her blood pressure was elevated at bedtime, but it came down to 140/66 after taking her bedtime dose of minipress.  A:  Safety checks q 15 minutes.  Emotional support provided.  Medications administered as ordered.  R:  Safety maintained on unit.
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
## 2014-12-08 ENCOUNTER — Encounter (HOSPITAL_COMMUNITY): Payer: Self-pay | Admitting: Psychiatry

## 2014-12-08 DIAGNOSIS — R45851 Suicidal ideations: Secondary | ICD-10-CM

## 2014-12-08 MED ORDER — LISINOPRIL 10 MG PO TABS
10.0000 mg | ORAL_TABLET | Freq: Every day | ORAL | Status: DC
  Administered 2014-12-08 – 2014-12-13 (×6): 10 mg via ORAL
  Filled 2014-12-08: qty 2
  Filled 2014-12-08 (×2): qty 1
  Filled 2014-12-08: qty 3
  Filled 2014-12-08 (×5): qty 1

## 2014-12-08 MED ORDER — PRAZOSIN HCL 1 MG PO CAPS
1.0000 mg | ORAL_CAPSULE | Freq: Every day | ORAL | Status: DC
  Administered 2014-12-08 – 2014-12-12 (×5): 1 mg via ORAL
  Filled 2014-12-08 (×2): qty 1
  Filled 2014-12-08: qty 14
  Filled 2014-12-08 (×5): qty 1

## 2014-12-08 MED ORDER — LORAZEPAM 1 MG PO TABS
1.0000 mg | ORAL_TABLET | Freq: Four times a day (QID) | ORAL | Status: DC | PRN
  Administered 2014-12-12: 1 mg via ORAL
  Filled 2014-12-08: qty 1

## 2014-12-08 MED ORDER — FLUOXETINE HCL 20 MG PO CAPS
20.0000 mg | ORAL_CAPSULE | Freq: Every day | ORAL | Status: DC
  Administered 2014-12-09 – 2014-12-13 (×5): 20 mg via ORAL
  Filled 2014-12-08 (×2): qty 1
  Filled 2014-12-08: qty 3
  Filled 2014-12-08 (×4): qty 1

## 2014-12-08 MED ORDER — TRAZODONE HCL 50 MG PO TABS
50.0000 mg | ORAL_TABLET | Freq: Every evening | ORAL | Status: DC | PRN
Start: 2014-12-08 — End: 2014-12-12
  Administered 2014-12-08 – 2014-12-11 (×3): 50 mg via ORAL
  Filled 2014-12-08 (×3): qty 1
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
## 2014-12-08 NOTE — Plan of Care (Signed)
Problem: Diagnosis: Increased Risk For Suicide Attempt Goal: STG-Patient Will Comply With Medication Regime Outcome: Progressing Pt compliant with medications as ordered when offer. Pt denies adverse drug reactions at present.

## 2014-12-08 NOTE — Progress Notes (Signed)
D: Pt presents with flat affect and depressed mood. Pt spent majority of this shift in bed, remains isolative and with drawned from others. Pt did not attend scheduled groups on unit despite multiple verbal prompts. Reports poor appetite.  A:  1:1 contact made with pt to conduct shift assessments and to assess needs/concerns. Verbal education done on all ordered medications prior to administration. PRN Vistaril administered for c/o anxiety as ordered and was effective in alleviating pt's symptoms. Assigned NP May was made aware of pt's elevated BP, new order received for Lisinopril 10 mg PO daily; first dose given prior to supper. Emotional support and availability provided to pt. Encouraged pt to voice needs and comply with treatment regimen. Safety maintained on Q 15 minutes checks as ordered without gestures or incident of self injurious behavior to note at present.  R: Pt cooperative with care. Pt verbalized understanding on medication education. Pt showered and changed her scrubs. Denied adverse drug reactions when assessed. Remains safe on unit. Plan of care continues.

## 2014-12-08 NOTE — Progress Notes (Signed)
Patient ID: Jane Snyder, female   DOB: May 09, 1971, 44 y.o.   MRN: [id number]  Adult Psychoeducational Group Note  Date:  12/08/2014 Time: 09:30am  Group Topic/Focus:  Recovery Goals:   The focus of this group is to identify appropriate goals for recovery and establish a plan to achieve them.  Participation Level:  Did Not Attend  Participation Quality:  n/a  Affect:  n/a  Cognitive:  n/a  Insight: n/a  Engagement in Group:  n/a  Modes of Intervention:  Discussion, Education, Exploration and Support  Additional Comments:  Pt did not attend. Pt in bed asleep.   [name] 12/08/2014, 10:36 AM

## 2014-12-08 NOTE — [facility] Group Notes (Signed)
[facility] Group Therapy 12/08/2014 1:15pm  Type of Therapy: [facility] Presentation  Pt did not attend, declined invitation.   Peri Maris, LCSWA 12/08/2014 1:46 PM
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
## 2014-12-08 NOTE — Progress Notes (Signed)
Pt did not attend Karaoke group tonight, she stayed down in her room and was resting.
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
## 2014-12-09 MED ORDER — ARIPIPRAZOLE 2 MG PO TABS
2.0000 mg | ORAL_TABLET | Freq: Every day | ORAL | Status: DC
  Administered 2014-12-09 – 2014-12-13 (×5): 2 mg via ORAL
  Filled 2014-12-09 (×7): qty 1
  Filled 2014-12-09: qty 14

## 2014-12-09 NOTE — Tx Team (Signed)
Initial Interdisciplinary Treatment Plan   PATIENT STRESSORS: Marital or family conflict Substance abuse   PATIENT STRENGTHS: Average or above average intelligence Communication skills General fund of knowledge Physical Health   PROBLEM LIST: Problem List/Patient Goals Date to be addressed Date deferred Reason deferred Estimated date of resolution  "I'd like to get some help for my depression" 12/09/14     "I would like to maybe get some therapy" 12/09/14                                                 DISCHARGE CRITERIA:  Ability to meet basic life and health needs Improved stabilization in mood, thinking, and/or behavior Need for constant or close observation no longer present Reduction of life-threatening or endangering symptoms to within safe limits Verbal commitment to aftercare and medication compliance  PRELIMINARY DISCHARGE PLAN: Outpatient therapy Participate in family therapy Return to previous living arrangement Return to previous work or school arrangements  PATIENT/FAMIILY INVOLVEMENT: This treatment plan has been presented to and reviewed with the patient, Jane Snyder.  The patient and family have been given the opportunity to ask questions and make suggestions.  Dannielle Burn 12/09/2014, 5:46 PM

## 2014-12-09 NOTE — Progress Notes (Signed)
Recreation Therapy Notes  Date: 07.15.16 Time: 9:30 am Location: [address] Dayroom  Group Topic: Stress Management  Goal Area(s) Addresses:  Patient will verbalize importance of using healthy stress management.  Patient will identify positive emotions associated with healthy stress management.   Intervention: Stress Management  Activity : Progressive Muscle Relaxation. LRT introduced and explained the stress management technique of progressive muscle relaxation. LRT used a script to deliver the technique. Patients were asked to follow the script read a loud by the LRT to participate in the stress management technique.  Education: Stress Management, Discharge Planning.   Education Outcome: Acknowledges edcuation/In group clarification offered  Clinical Observations/Feedback: Patient did not attend group.   [name], LRT/CTRS  [name] A 12/09/2014 11:51 AM

## 2014-12-09 NOTE — Progress Notes (Signed)
[facility] MD Progress Note  12/09/2014 5:25 PM Jane Snyder  MRN:  [id number] Subjective:  Patient states she feels " maybe just a little bit better", but states she still feels very depressed. Denies medication side effects. Objective : I have discussed case with treatment team and have met with patient. Patient remains  Quite depressed, sad , constricted in affect. She has passive thoughts of " rather being dead", but denies any plan or intention of hurting herself and identifies her daughter as a protective factor. She denies medication side effects. She has been isolative on unit, with limited milieu/group participation, but today did come and sit in day room for a period of time. No disruptive of agitated behaviors on unit . As noted , patient reports drinking regularly prior to admission. She is not currently presenting with any alcohol withdrawal symptoms- no tremors, no diaphoresis, no restlessness. Vitals stable .   Principal Problem: MDD (major depressive disorder), recurrent severe, without psychosis Diagnosis:   Patient Active Problem List   Diagnosis Date Noted  . MDD (major depressive disorder), recurrent severe, without psychosis [F33.2] 12/07/2014  . Suicidal ideation [R45.851] 12/07/2014  . Chronic post-traumatic stress disorder (PTSD) [F43.12] 12/07/2014  . Suicide attempt [T14.91]   . Anxiety attack [F41.0] 07/31/2013  . Major depression [F32.2] 08/22/2012  . JPVGKKDP(947.0) [R51] 08/22/2012   Total Time spent with patient: 25 minutes    Past Medical History:  Past Medical History  Diagnosis Date  . Asthma   . Depression   . Suicide attempt     Past Surgical History  Procedure Laterality Date  . Breast surgery    . Tubal ligation    . Cosmetic surgery      Abdomen  . Wisdom tooth extraction     Family History: History reviewed. No pertinent family history. Social History:  History  Alcohol Use  . Yes    Comment: social     History  Drug Use No    History   Social History  . Marital Status: Single    Spouse Name: N/A  . Number of Children: N/A  . Years of Education: N/A   Social History Main Topics  . Smoking status: Never Smoker   . Smokeless tobacco: Never Used  . Alcohol Use: Yes     Comment: social  . Drug Use: No  . Sexual Activity: Not on file   Other Topics Concern  . None   Social History Narrative   Additional History:    Sleep: Fair  Appetite:  Fair   Assessment:   Musculoskeletal: Strength & Muscle Tone: within normal limits Gait & Station: normal Patient leans: N/A   Psychiatric Specialty Exam: Physical Exam  ROS- denies nausea, denies vomiting.  Denies excessive sedation.  Blood pressure 128/75, pulse 89, temperature 98.4 F (36.9 C), temperature source Oral, resp. rate 18, height 5' 4.25" (1.632 m), weight 201 lb (91.173 kg), last menstrual period 11/14/2014.Body mass index is 34.23 kg/(m^2).  General Appearance: Fairly Groomed  Engineer, water::  Fair  Speech:  Normal Rate  Volume:  Decreased  Mood:  Depressed  Affect:  Constricted  Thought Process:  Linear  Orientation:  Other:  fully alert and attentive   Thought Content:  denies hallucinations, no delusions , not internally preoccupied   Suicidal Thoughts:  Yes.  without intent/plan- describes passive ideations of wanting to die but denies any actual suicidal ideations at present, and contracts for safety on the unit. Identifies family/daughter as protective factor  Homicidal Thoughts:  No  Memory:  recent and remote grossly intact   Judgement:  Fair  Insight:  Fair  Psychomotor Activity:  Decreased  Concentration:  Good  Recall:  Good  Fund of Knowledge:Good  Language: Good  Akathisia:  Negative  Handed:  Right  AIMS (if indicated):     Assets:  Communication Skills Desire for Improvement Resilience  ADL's: fair   Cognition: WNL  Sleep:        Current Medications: Current Facility-Administered Medications  Medication  Dose Route Frequency Provider Last Rate Last Dose  . acetaminophen (TYLENOL) tablet 650 mg  650 mg Oral Q4H PRN Delfin Gant, NP   650 mg at 12/09/14 0848  . albuterol (PROVENTIL HFA;VENTOLIN HFA) 108 (90 BASE) MCG/ACT inhaler 1-2 puff  1-2 puff Inhalation Q6H PRN Delfin Gant, NP      . alum & mag hydroxide-simeth (MAALOX/MYLANTA) 200-200-20 MG/5ML suspension 30 mL  30 mL Oral PRN Delfin Gant, NP      . ARIPiprazole (ABILIFY) tablet 2 mg  2 mg Oral Daily Myer Peer Cobos, MD      . FLUoxetine (PROZAC) capsule 20 mg  20 mg Oral Daily Jenne Campus, MD   20 mg at 12/09/14 0847  . ibuprofen (ADVIL,MOTRIN) tablet 600 mg  600 mg Oral Q8H PRN Delfin Gant, NP      . lisinopril (PRINIVIL,ZESTRIL) tablet 10 mg  10 mg Oral Daily Kerrie Buffalo, NP   10 mg at 12/09/14 0847  . LORazepam (ATIVAN) tablet 1 mg  1 mg Oral Q6H PRN Jenne Campus, MD      . ondansetron (ZOFRAN) tablet 4 mg  4 mg Oral Q8H PRN Delfin Gant, NP      . prazosin (MINIPRESS) capsule 1 mg  1 mg Oral QHS Jenne Campus, MD   1 mg at 12/08/14 2245  . traZODone (DESYREL) tablet 50 mg  50 mg Oral QHS PRN Harriet Butte, NP   50 mg at 12/08/14 2309    Lab Results: No results found for this or any previous visit (from the past 48 hour(s)).  Physical Findings: AIMS: Facial and Oral Movements Muscles of Facial Expression: None, normal Lips and Perioral Area: None, normal Jaw: None, normal Tongue: None, normal,Extremity Movements Upper (arms, wrists, hands, fingers): None, normal Lower (legs, knees, ankles, toes): None, normal, Trunk Movements Neck, shoulders, hips: None, normal, Overall Severity Severity of abnormal movements (highest score from questions above): None, normal Incapacitation due to abnormal movements: None, normal Patient's awareness of abnormal movements (rate only patient's report): No Awareness, Dental Status Current problems with teeth and/or dentures?: No Does patient usually  wear dentures?: No  CIWA:    COWS:      Assessment- patient remains significantly depressed, with a sad affect, passive thoughts of death ( but denies SI), and tendency to remain isolated . She is tolerating medications well thus far - currently on Prozac. She agrees to Abilify augmentation and we reviewed side effects. She is not presenting with any alcohol withdrawal symptoms. PTSD symptoms are not prominent at this time- major presentation is of severe depression.    Treatment Plan Summary: Daily contact with patient to assess and evaluate symptoms and progress in treatment, Medication management, Plan ongoing inpatient treatment and medications as below Continue Prozac 20 mgrs QDAY for depression, PTSD Start Abilify 2 mgrs QDAY as antidepressant augmentation Continue Minipress 1 mgr QHS to address PTSD related nightmares , disrupted sleep Continue Trazodone  50 mgrs QHS PRN for Insomnia Continue Ativan PRNs for severe depression or alcohol WDL symptoms  Continue Zestril for HTN. Order TSH to monitor thyroid function   Medical Decision Making:  Established Problem, Stable/Improving (1), Review of Psycho-Social Stressors (1), Review or order clinical lab tests (1) and Review of Medication Regimen & Side Effects (2)     COBOS, FERNANDO 12/09/2014, 5:25 PM
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
## 2014-12-09 NOTE — Progress Notes (Signed)
[place] Group Notes:  (Nursing/MHT/Case Management/Adjunct)  Date:  12/09/2014  Time:  9:25 PM  Type of Therapy:  Psychoeducational Skills  Participation Level:  Active  Participation Quality:  Appropriate  Affect:  Appropriate  Cognitive:  Appropriate  Insight:  Appropriate  Engagement in Group:  Engaged  Modes of Intervention:  Discussion  Summary of Progress/Problems:Altheia rated her day at a 4.  One positive thing that happened for her today was attending group.  Jeanette Caprice 12/09/2014, 9:25 PM

## 2014-12-09 NOTE — BHH Group Notes (Signed)
[facility] LCSW Group Therapy 12/09/2014 1:15pm  Type of Therapy: Group Therapy- Feelings Around Relapse and Recovery  Pt did not attend, declined invitation.   Peri Maris, Latanya Presser [phone] 12/09/2014 4:29 PM

## 2014-12-09 NOTE — Progress Notes (Signed)
D: Pt with flat affect is isolative and withdrawn to self; in bed, not interested in doing anything. Pt continues to complain of severe depression and anxiety. Pt did not attend [place] group. Pt continues to be alert and oriented x4. Pt denies SI; however, Pt had to think to deny. Pt denies HI/AVH and pain. B/P at 2000 was 135/96 sitting and 144/76 standing. A: Medications administered as prescribed.  Support, encouragement, and safe environment provided.  15-minute safety checks continue. R: Pt was med compliant.  Safety checks continue

## 2014-12-09 NOTE — Progress Notes (Signed)
D: Patient very solemn and reserved. She states she is still depressed, but not as bad as yesterday or the day before. She said she had a pleasant conversation with her daughter on the phone today. Her appetite is still poor. Her goal today is to at least attend group. She denies SI/HI. A: Encouraged patient to focus on setting one goal and trying to meet it tomorrow. Focus on positive thoughts.  R: Continue to monitor for safety. She is beginning to show some improvement by participating in group tonight. Safety maintained.

## 2014-12-09 NOTE — BHH Group Notes (Signed)
[facility] LCSW Aftercare Discharge Planning Group Note  12/09/2014 8:45 AM  Pt did not attend, declined invitation.   Peri Maris, LCSWA 12/09/2014 10:12 AM

## 2014-12-09 NOTE — Progress Notes (Signed)
[facility] 7a-7p shift:   D:  Pt. Has been severely depressed and anhedonic this shift. She had been isolative; refusing to eat but with encouragement, she ate a small portion of her dinner.  She also talked briefly about her perceived lack of support within her family and with her significant other.    A: Support, education, and encouragement provided as needed.  Level 3 checks continued for safety.  R: Pt. Minimally receptive to intervention/s but shows slight improvement.  Safety maintained.  Prudencio Pair, RN

## 2014-12-10 DIAGNOSIS — F332 Major depressive disorder, recurrent severe without psychotic features: Principal | ICD-10-CM

## 2014-12-10 LAB — TSH: TSH: 1.656 u[IU]/mL (ref 0.350–4.500)

## 2014-12-10 NOTE — Progress Notes (Signed)
Adult Psychoeducational Group Note  Date:  12/10/2014 Time:  9:11 PM  Group Topic/Focus:  Wrap-Up Group:   The focus of this group is to help patients review their daily goal of treatment and discuss progress on daily workbooks.  Participation Level:  Active  Participation Quality:  Appropriate and Attentive  Affect:  Appropriate  Cognitive:  Appropriate  Insight: Appropriate  Engagement in Group:  Engaged  Modes of Intervention:  Discussion  Additional Comments:  Pt stated her goal for today was to be out of her room and visible in the mileu. Pt stated she was able to accomplish this. Pt stated tomorrow she wants to work on participating in groups.  Clint Bolder 12/10/2014, 9:11 PM

## 2014-12-10 NOTE — BHH Group Notes (Signed)
[facility] LCSW Group Therapy  12/10/2014  1:15 PM  Type of Therapy:  Group Therapy  Participation Level:  Did Not Attend althouh encouraged by MHT   Summary of Progress/Problems: Summary of Progress/Problems: The main focus of today's process group was to learn how to use a decisional balance exercise to move forward in the Stages of Change, which were described and discussed. Motivational Interviewing and a worksheet were utilized to help patients explore in depth the perceived benefits and costs of unhealthy coping techniques, as well as the benefits and costs of replacing that with a healthy coping skills.   Lyla Glassing

## 2014-12-10 NOTE — Progress Notes (Addendum)
Psychoeducational Group Note  Date: 12/10/2014 Time: 0930  Group Topic/Focus:  Identifying Needs:   The focus of this group is to help patients identify their personal needs that have been historically problematic and identify healthy behaviors to address their needs.  Participation Level:  Patient did not attend.  Participation Quality:  N/A  Affect:  N/A  Cognitive:  N/A  Insight:  N/A  Engagement in Group: N/A   Additional Comments:   7/16/20161:57 PM Vasco Chong, Trixie Rude

## 2014-12-10 NOTE — Progress Notes (Signed)
Patient with c/o dizziness after vital signs were taken. She was negative for orthostatic hypotension. She was promptly offered a chair and gatorade/gingerale upon sitting. She refused both. She requested to go back to her room. Upon entering her room she stated she was nauseous. She was offered a basin. No vomiting observed. Instructed to report to her nurse or the tech if she began vomiting. Patient verbalized understanding. Will continue to monitor.

## 2014-12-10 NOTE — Progress Notes (Signed)
D) Pt having a hard time getting out of bed tis morning. States, "I think the medication is making me very sleepy". Affect and mood are depressed as well. Standing at the medication window and stating "my children don't need my anymore and I really don't have a lot to live for". Pt rates her depression at a 7, hopelessness and helplessness both at an 8. Denies SI and HI presently. Pt had a headache and stated "I have had it since I came in and it won't go away". Pt given tylenol today for there headache. A) Given support, reassurance and praise along with encouragment. Provided with a 1:1.  R) Pt denies SI and HI. Continues to be sad and depressed.

## 2014-12-10 NOTE — Progress Notes (Signed)
D:Patient in the dayroom on approach.  Patient appears sad and depressed.  Patient has a flat affect.  Patient rates her depression 6/10.  Patient states it is slowly getting better.  Patient states she is going to focus more on herself.  Patient denies SI/HI and denies AVH. A: Staff to monitor Q 15 mins for safety.  Encouragement and support offered.  Scheduled medications administered per orders. Trazodone administered prn for sleep. R: Patient remains safe on the unit.  Patient attended group tonight.  Patient visible on the unit.  Patient taking administered medications

## 2014-12-10 NOTE — BHH Group Notes (Signed)
[facility] Group Notes:  (Nursing/MHT/Case Management/Adjunct)  Date:  12/10/2014  Time:  1:51 PM  Type of Therapy:  Goals Group ; The focus of the group is to educate patients on how to set healthy goals as well as identify skills needed to accomplish these goals/  Participation Level:  Patient did not attend.  Participation Quality: N/A   Affect:  N/A  Cognitive:  N/A  Insight:     Modes of Intervention:    Summary of Progress/Problems:  Lauralyn Primes 12/10/2014, 1:51 PM

## 2014-12-10 NOTE — Progress Notes (Signed)
Patient ID: Jane Snyder, female   DOB: Feb 26, 1971, 44 y.o.   MRN: [id number] [facility] MD Progress Note  12/10/2014 7:20 PM Jane Snyder  MRN:  [id number]  Subjective:  Jane Snyder reports, "I'm trying very hard to feel better. But, I'm still feeling very depressed. The doctor added Abilify to my medicine, I'm hoping it will help me"  Denies medication side effects.  Objective : I have discussed case with treatment team and have met with patient. Patient remains  Quite depressed, sad , constricted in affect, but denies feeling suicidal today. She denies medication side effects. She is participating in group milieu, but will go back to her room after group sessions. She says her depression stems from bad relationship that is going no where. Says her partner has not tried to reach out to her since being in the hospital. She denies any AVH.  Principal Problem: MDD (major depressive disorder), recurrent severe, without psychosis Diagnosis:   Patient Active Problem List   Diagnosis Date Noted  . MDD (major depressive disorder), recurrent severe, without psychosis [F33.2] 12/07/2014  . Suicidal ideation [R45.851] 12/07/2014  . Chronic post-traumatic stress disorder (PTSD) [F43.12] 12/07/2014  . Suicide attempt [T14.91]   . Anxiety attack [F41.0] 07/31/2013  . Major depression [F32.2] 08/22/2012  . GQQPYPPJ(093.2) [R51] 08/22/2012   Total Time spent with patient: 25 minutes    Past Medical History:  Past Medical History  Diagnosis Date  . Asthma   . Depression   . Suicide attempt     Past Surgical History  Procedure Laterality Date  . Breast surgery    . Tubal ligation    . Cosmetic surgery      Abdomen  . Wisdom tooth extraction     Family History: History reviewed. No pertinent family history. Social History:  History  Alcohol Use  . Yes    Comment: social     History  Drug Use No    History   Social History  . Marital Status: Single    Spouse Name: N/A  . Number  of Children: N/A  . Years of Education: N/A   Social History Main Topics  . Smoking status: Never Smoker   . Smokeless tobacco: Never Used  . Alcohol Use: Yes     Comment: social  . Drug Use: No  . Sexual Activity: Not on file   Other Topics Concern  . None   Social History Narrative   Additional History:    Sleep: Fair  Appetite:  Fair  Musculoskeletal: Strength & Muscle Tone: within normal limits Gait & Station: normal Patient leans: N/A   Psychiatric Specialty Exam: Physical Exam  ROS- denies nausea, denies vomiting.  Denies excessive sedation.  Blood pressure 133/75, pulse 73, temperature 98.5 F (36.9 C), temperature source Oral, resp. rate 18, height 5' 4.25" (1.632 m), weight 91.173 kg (201 lb), last menstrual period 11/14/2014.Body mass index is 34.23 kg/(m^2).  General Appearance: Fairly Groomed  Engineer, water::  Fair  Speech:  Normal Rate  Volume:  Decreased  Mood:  Depressed  Affect:  Constricted  Thought Process:  Linear  Orientation:  Other:  fully alert and attentive   Thought Content:  denies hallucinations, no delusions , not internally preoccupied   Suicidal Thoughts:  Denies  Homicidal Thoughts:  No  Memory:  recent and remote grossly intact   Judgement:  Fair  Insight:  Fair  Psychomotor Activity:  Decreased  Concentration:  Good  Recall:  [place] of Knowledge:Good  Language: Good  Akathisia:  Negative  Handed:  Right  AIMS (if indicated):     Assets:  Communication Skills Desire for Improvement Resilience  ADL's: fair   Cognition: WNL  Sleep:      Current Medications: Current Facility-Administered Medications  Medication Dose Route Frequency Provider Last Rate Last Dose  . acetaminophen (TYLENOL) tablet 650 mg  650 mg Oral Q4H PRN Delfin Gant, NP   650 mg at 12/10/14 1214  . albuterol (PROVENTIL HFA;VENTOLIN HFA) 108 (90 BASE) MCG/ACT inhaler 1-2 puff  1-2 puff Inhalation Q6H PRN Delfin Gant, NP      . alum & mag  hydroxide-simeth (MAALOX/MYLANTA) 200-200-20 MG/5ML suspension 30 mL  30 mL Oral PRN Delfin Gant, NP      . ARIPiprazole (ABILIFY) tablet 2 mg  2 mg Oral Daily Jenne Campus, MD   2 mg at 12/10/14 8264  . FLUoxetine (PROZAC) capsule 20 mg  20 mg Oral Daily Jenne Campus, MD   20 mg at 12/10/14 1583  . ibuprofen (ADVIL,MOTRIN) tablet 600 mg  600 mg Oral Q8H PRN Delfin Gant, NP   600 mg at 12/09/14 1952  . lisinopril (PRINIVIL,ZESTRIL) tablet 10 mg  10 mg Oral Daily Kerrie Buffalo, NP   10 mg at 12/10/14 0940  . LORazepam (ATIVAN) tablet 1 mg  1 mg Oral Q6H PRN Jenne Campus, MD      . ondansetron (ZOFRAN) tablet 4 mg  4 mg Oral Q8H PRN Delfin Gant, NP      . prazosin (MINIPRESS) capsule 1 mg  1 mg Oral QHS Jenne Campus, MD   1 mg at 12/09/14 2115  . traZODone (DESYREL) tablet 50 mg  50 mg Oral QHS PRN Harriet Butte, NP   50 mg at 12/08/14 2309    Lab Results: No results found for this or any previous visit (from the past 48 hour(s)).  Physical Findings: AIMS: Facial and Oral Movements Muscles of Facial Expression: None, normal Lips and Perioral Area: None, normal Jaw: None, normal Tongue: None, normal,Extremity Movements Upper (arms, wrists, hands, fingers): None, normal Lower (legs, knees, ankles, toes): None, normal, Trunk Movements Neck, shoulders, hips: None, normal, Overall Severity Severity of abnormal movements (highest score from questions above): None, normal Incapacitation due to abnormal movements: None, normal Patient's awareness of abnormal movements (rate only patient's report): No Awareness, Dental Status Current problems with teeth and/or dentures?: No Does patient usually wear dentures?: No  CIWA:    COWS:      Assessment- patient remains significantly depressed, with a sad affect, denies SI, and tendency to remain isolated . She is tolerating medications well thus far - currently on Prozac. She agrees to Abilify augmentation and we  reviewed side effects, none reported today. She is not presenting with any alcohol withdrawal symptoms. PTSD symptoms are not prominent at this time- major presentation is of severe depression.   Treatment Plan Summary: Daily contact with patient to assess and evaluate symptoms and progress in treatment, Medication management, Plan ongoing inpatient treatment and medications as below Continue Prozac 20 mgrs QDAY for depression, PTSD Continue Abilify 2 mgrs QDAY as antidepressant augmentation Continue Minipress 1 mgr QHS to address PTSD related nightmares , disrupted sleep Continue Trazodone 50 mgrs QHS PRN for Insomnia Continue Ativan PRNs for severe depression or alcohol WDL symptoms  Continue Zestril for HTN. Order TSH to monitor thyroid function, reviewed current lab results, TSH results pending.  Medical Decision Making:  Established Problem, Stable/Improving (1), Review of Psycho-Social Stressors (1), Review or order clinical lab tests (1) and Review of Medication Regimen & Side Effects (2)  Encarnacion Slates, PMHNP, FNP-BC 12/10/2014, 7:20 PM I agree with assessment and plan Geralyn Flash A. Sabra Heck, M.D.

## 2014-12-11 MED ORDER — IBUPROFEN 600 MG PO TABS
600.0000 mg | ORAL_TABLET | Freq: Three times a day (TID) | ORAL | Status: DC | PRN
  Administered 2014-12-13: 600 mg via ORAL
  Filled 2014-12-11: qty 1

## 2014-12-11 MED ORDER — LORATADINE 10 MG PO TABS
10.0000 mg | ORAL_TABLET | Freq: Every day | ORAL | Status: DC
  Administered 2014-12-11 – 2014-12-13 (×3): 10 mg via ORAL
  Filled 2014-12-11: qty 1
  Filled 2014-12-11: qty 14
  Filled 2014-12-11 (×5): qty 1

## 2014-12-11 NOTE — Progress Notes (Signed)
D) Pt up and in the dayroom for groups this morning. States, attending the groups is my goal today. Has been paying attention and interacting appropriately with her peers. Pt rates her depression at a 4, hopelessness at a 1 and her anxiety at a 3. Denies SI and HI. Was able to state that although her children were not an incentive to live because "they don't need me" her grandchildren "are young and they still need me". Smiled when she expressed this. Interacting appropriately with her peers. A) Given support and reassurance along with praise. Encouragement given. Provided with a 1:1 Praised for setting her goals and achieving them todoay R) Denies SI and HI.

## 2014-12-11 NOTE — Progress Notes (Signed)
Adult Psychoeducational Group Note  Date:  12/11/2014 Time:  9:35 PM  Group Topic/Focus:  Wrap-Up Group:   The focus of this group is to help patients review their daily goal of treatment and discuss progress on daily workbooks.  Participation Level:  Active  Participation Quality:  Appropriate and Attentive  Affect:  Appropriate  Cognitive:  Appropriate  Insight: Appropriate  Engagement in Group:  Engaged  Modes of Intervention:  Discussion  Additional Comments:  Pt stated her goal for today was to attend all groups and participate more. Pt stated she attended all but one group today. Pt stated tomorrow she wants to work on building a discharge plan with her doctor and Education officer, museum.  Clint Bolder 12/11/2014, 9:35 PM

## 2014-12-11 NOTE — Progress Notes (Signed)
Patient ID: Jane Snyder, female   DOB: [birth year], 44 y.o.   MRN: [id number] Patient ID: Jane Snyder, female   DOB: 1971-03-22, 44 y.o.   MRN: [id number] [facility] MD Progress Note  12/11/2014 4:02 PM [name]  MRN:  [id number]  Subjective:  [name] reports, "I did not sleep well last night. I got allergies & headache pains. I'm sneezing a lot".   Objective : I have discussed case with treatment team and have met with patient. Patient remains quite depressed, sad , constricted in affect, but denies feeling suicidal today. She denies medication side effects. She is participating in group milieu, but will go back to her room after group sessions. She says her depression stems from bad relationship that is going no where. Says her partner has not tried to reach out to her since being in the hospital. She denies any AVH.oday, she is complaining of headache pains, allergy symptoms. She is started on Claritin 10 mg. Has an order for Ibuprofen 600 mg for headache/pains.  Principal Problem: MDD (major depressive disorder), recurrent severe, without psychosis Diagnosis:   Patient Active Problem List   Diagnosis Date Noted  . MDD (major depressive disorder), recurrent severe, without psychosis [F33.2] 12/07/2014  . Suicidal ideation [R45.851] 12/07/2014  . Chronic post-traumatic stress disorder (PTSD) [F43.12] 12/07/2014  . Suicide attempt [T14.91]   . Anxiety attack [F41.0] 07/31/2013  . Major depression [F32.2] 08/22/2012  . WERXVQMG(867.6) [R51] 08/22/2012   Total Time spent with patient: 25 minutes    Past Medical History:  Past Medical History  Diagnosis Date  . Asthma   . Depression   . Suicide attempt     Past Surgical History  Procedure Laterality Date  . Breast surgery    . Tubal ligation    . Cosmetic surgery      Abdomen  . Wisdom tooth extraction     Family History: History reviewed. No pertinent family history. Social History:  History  Alcohol Use   . Yes    Comment: social     History  Drug Use No    History   Social History  . Marital Status: Single    Spouse Name: N/A  . Number of Children: N/A  . Years of Education: N/A   Social History Main Topics  . Smoking status: Never Smoker   . Smokeless tobacco: Never Used  . Alcohol Use: Yes     Comment: social  . Drug Use: No  . Sexual Activity: Not on file   Other Topics Concern  . None   Social History Narrative   Additional History:    Sleep: Fair  Appetite:  Fair  Musculoskeletal: Strength & Muscle Tone: within normal limits Gait & Station: normal Patient leans: N/A   Psychiatric Specialty Exam: Physical Exam  ROS- denies nausea, denies vomiting.  Denies excessive sedation.  Blood pressure 105/51, pulse 107, temperature 98.4 F (36.9 C), temperature source Oral, resp. rate 18, height 5' 4.25" (1.632 m), weight 91.173 kg (201 lb), last menstrual period 11/14/2014.Body mass index is 34.23 kg/(m^2).  General Appearance: Fairly Groomed  Engineer, water::  Fair  Speech:  Normal Rate  Volume:  Decreased  Mood:  Depressed  Affect:  Constricted  Thought Process:  Linear  Orientation:  Other:  fully alert and attentive   Thought Content:  denies hallucinations, no delusions , not internally preoccupied   Suicidal Thoughts:  Denies  Homicidal Thoughts:  No  Memory:  recent and remote grossly intact  Judgement:  Fair  Insight:  Fair  Psychomotor Activity:  Decreased  Concentration:  Good  Recall:  Good  Fund of Knowledge:Good  Language: Good  Akathisia:  Negative  Handed:  Right  AIMS (if indicated):     Assets:  Communication Skills Desire for Improvement Resilience  ADL's: fair   Cognition: WNL  Sleep:  Number of Hours: 6.75   Current Medications: Current Facility-Administered Medications  Medication Dose Route Frequency Provider Last Rate Last Dose  . acetaminophen (TYLENOL) tablet 650 mg  650 mg Oral Q4H PRN Delfin Gant, NP   650 mg at  12/10/14 1214  . albuterol (PROVENTIL HFA;VENTOLIN HFA) 108 (90 BASE) MCG/ACT inhaler 1-2 puff  1-2 puff Inhalation Q6H PRN Delfin Gant, NP      . alum & mag hydroxide-simeth (MAALOX/MYLANTA) 200-200-20 MG/5ML suspension 30 mL  30 mL Oral PRN Delfin Gant, NP      . ARIPiprazole (ABILIFY) tablet 2 mg  2 mg Oral Daily Jenne Campus, MD   2 mg at 12/11/14 0806  . FLUoxetine (PROZAC) capsule 20 mg  20 mg Oral Daily Jenne Campus, MD   20 mg at 12/11/14 0806  . ibuprofen (ADVIL,MOTRIN) tablet 600 mg  600 mg Oral Q8H PRN Delfin Gant, NP   600 mg at 12/09/14 1952  . lisinopril (PRINIVIL,ZESTRIL) tablet 10 mg  10 mg Oral Daily Kerrie Buffalo, NP   10 mg at 12/11/14 0806  . LORazepam (ATIVAN) tablet 1 mg  1 mg Oral Q6H PRN Jenne Campus, MD      . ondansetron (ZOFRAN) tablet 4 mg  4 mg Oral Q8H PRN Delfin Gant, NP      . prazosin (MINIPRESS) capsule 1 mg  1 mg Oral QHS Jenne Campus, MD   1 mg at 12/10/14 2116  . traZODone (DESYREL) tablet 50 mg  50 mg Oral QHS PRN Harriet Butte, NP   50 mg at 12/10/14 2116    Lab Results:  Results for orders placed or performed during the hospital encounter of 12/07/14 (from the past 48 hour(s))  TSH     Status: None   Collection Time: 12/10/14  7:16 PM  Result Value Ref Range   TSH 1.656 0.350 - 4.500 uIU/mL    Comment: Performed at [facility]    Physical Findings: AIMS: Facial and Oral Movements Muscles of Facial Expression: None, normal Lips and Perioral Area: None, normal Jaw: None, normal Tongue: None, normal,Extremity Movements Upper (arms, wrists, hands, fingers): None, normal Lower (legs, knees, ankles, toes): None, normal, Trunk Movements Neck, shoulders, hips: None, normal, Overall Severity Severity of abnormal movements (highest score from questions above): None, normal Incapacitation due to abnormal movements: None, normal Patient's awareness of abnormal movements (rate only patient's  report): No Awareness, Dental Status Current problems with teeth and/or dentures?: No Does patient usually wear dentures?: No  CIWA:    COWS:      Assessment- patient remains significantly depressed, with a sad affect, denies SI, and tendency to remain isolated, however, says her mood is improving some . She is tolerating medications well thus far - currently on Prozac. Remains on  Abilify augmentation to her antidepressant & reviewed side effects, none reported today. She is not presenting with any alcohol withdrawal symptoms. PTSD symptoms are not prominent at this time- major presentation is of severe depression.   Treatment Plan Summary: Daily contact with patient to assess and evaluate symptoms and progress in  treatment, Medication management, Plan ongoing inpatient treatment and medications as below Continue Prozac 20 mgrs QDAY for depression, PTSD Continue Abilify 2 mgrs QDAY as antidepressant augmentation Continue Minipress 1 mgr QHS to address PTSD related nightmares , disrupted sleep Continue Trazodone 50 mgrs QHS PRN for Insomnia Continue Ativan PRNs for severe depression or alcohol WDL symptoms  Continue Zestril for HTN, Claritin 10 mg for allergies, motrin 600 mg for headache, pains. Order TSH to monitor thyroid function, reviewed current lab results, normal TSH result.  Medical Decision Making:  Established Problem, Stable/Improving (1), Review of Psycho-Social Stressors (1), Review or order clinical lab tests (1) and Review of Medication Regimen & Side Effects (2)  Encarnacion Slates, PMHNP, FNP-BC 12/11/2014, 4:02 PM I agree with assessment and plan Geralyn Flash A. Sabra Heck, M.D.

## 2014-12-11 NOTE — BHH Group Notes (Signed)
[facility] [place] LCSW Group Therapy  12/11/2014   1:15 PM  Type of Therapy:  Group Therapy  Participation Level:  Did Not Attend despite encouragement from [facility] and MHT  Jane Snyder, Jane Snyder

## 2014-12-11 NOTE — BHH Group Notes (Signed)
[place] Group Notes:  (Nursing/MHT/Case Management/Adjunct)  Date:  12/11/2014  Time:  0930  Type of Therapy:   Life Skills Participation Level: good   Participation Quality: fair   Affect:  flat  Cognitive: intact   Insight:  intact  Engagement in Group: engaged   Modes of Intervention:    Summary of Progress/Problems:  Lauralyn Primes 12/11/2014, 2:35 PM

## 2014-12-12 MED ORDER — TRAZODONE HCL 100 MG PO TABS
100.0000 mg | ORAL_TABLET | Freq: Every evening | ORAL | Status: DC | PRN
  Administered 2014-12-12: 100 mg via ORAL
  Filled 2014-12-12: qty 28
  Filled 2014-12-12: qty 1

## 2014-12-12 NOTE — BHH Group Notes (Signed)
[place] LCSW Group Therapy  12/12/2014 1:15pm  Type of Therapy:  Group Therapy vercoming Obstacles  Pt did not attend, declined invitation.   Peri Maris, [place] 12/12/2014 5:38 PM

## 2014-12-12 NOTE — Progress Notes (Signed)
Patient ID: Jane Snyder, female   DOB: [birth year], 44 y.o.   MRN: [id number] D-Pleasant on initial contact at am med pass. Pleasant and verbal. She states she slept well last HS. Talked about her family and goal for today is discharge. A-Support offered Monitored for safety medications as ordered. R-No complaints at this time. She has some concerns re her FMLA and referred to Education officer, museum for this concern.

## 2014-12-12 NOTE — Progress Notes (Signed)
Patient ID: Jane Snyder, female   DOB: Feb 09, 1971, 44 y.o.   MRN: [id number] Turned in self inventory and scored self a 2 on depression and a 3 on anxiety and 0 for hopelessness. She is pleasant and verbally appropriate. Her goal today is for discharge.

## 2014-12-12 NOTE — Progress Notes (Signed)
Recreation Therapy Notes  Date: 07.18.16 Time: 9:30 am Location: [address] Group Room  Group Topic: Stress Management  Goal Area(s) Addresses:  Patient will verbalize importance of using healthy stress management.  Patient will identify positive emotions associated with healthy stress management.   Intervention: Stress Management  Activity :  Guided Automotive engineer.  LRT introduced and educated patients on the stress management technique of guided imagery.  A script was used to deliver the technique to patients.  Patients were asked to follow the script read a loud by LRT to engage in practicing the stress management technique.  Education:  Stress Management, Discharge Planning.   Education Outcome: Acknowledges edcuation/In group clarification offered/Needs additional education  Clinical Observations/Feedback: Patient did not attend group.   [name], LRT/CTRS         Ria Comment, Ezmeralda Stefanick A 12/12/2014 2:07 PM

## 2014-12-12 NOTE — BHH Group Notes (Signed)
[facility] LCSW Aftercare Discharge Planning Group Note  12/12/2014 8:45 AM  Participation Quality: Alert, Appropriate and Oriented  Mood/Affect: Appropriate  Depression Rating: 2  Anxiety Rating: 4  Thoughts of Suicide: Pt denies SI/HI  Will you contract for safety? Yes  Current AVH: Pt denies  Plan for Discharge/Comments: Pt attended discharge planning group and actively participated in group. CSW discussed suicide prevention education with the group and encouraged them to discuss discharge planning and any relevant barriers. Pt reports improved mood and inquired about FMLA paperwork.   Transportation Means: Pt reports access to transportation  Supports: No supports mentioned at this time  Jane Snyder, [place] 12/12/2014 1:11 PM

## 2014-12-12 NOTE — Progress Notes (Signed)
Writer spoke with patient 1;1 and she reports having had a good day today. She reports that her 2 grandchildren visited her on today and she spoke about how much she loves them. She attended group this evening and participated. She currently denies si/hi/a/v hallucinations. Writer has observed patient in the dayroom briefly watching tv. Support and encouragement given, safety maintained on unit with 15 min checks.

## 2014-12-12 NOTE — Progress Notes (Signed)
[place] Group Notes:  (Nursing/MHT/Case Management/Adjunct)  Date:  12/12/2014  Time:  9:15 PM  Type of Therapy:  Psychoeducational Skills  Participation Level:  Active  Participation Quality:  Appropriate and Attentive  Affect:  Appropriate and Excited  Cognitive:  Appropriate  Insight:  Appropriate and Good  Engagement in Group:  Engaged  Modes of Intervention:  Activity  Summary of Progress/Problems: Pts played a therapeutic activity of [employer]. Pt was engaged and actively participated.  Clint Bolder 12/12/2014, 9:15 PM

## 2014-12-12 NOTE — Progress Notes (Signed)
Patient ID: Jane Snyder, female   DOB: Dec 21, 1970, 44 y.o.   MRN: [id number]  [facility] MD Progress Note  12/12/2014 12:37 PM JEARLDEAN GUTT  MRN:  [id number]  Subjective:   Patient states she is better overall- feeling less severely depressed, and endorsing an increased sense of hope and optimism. She does state her sleep had been fair, which she states may be at least partially due to " allergies ", but improved last night , and slept better . Her family visited her and this has helped her feel better as well. At this time not endorsing medication side effects.  Objective : I have discussed case with treatment team and have met with patient. Compared her initial presentation patient is significantly improved. She states she is still depressed but acknowledges much improvement. She presents with a fuller range of affect, is more interactive and verbal than she had been , and presents with a more reactive affect, smiling at times appropriately. Denies medication side effects at present. No disruptive behaviors on the unit. Going to groups.  As noted, sleep now improved .  Principal Problem: MDD (major depressive disorder), recurrent severe, without psychosis Diagnosis:   Patient Active Problem List   Diagnosis Date Noted  . MDD (major depressive disorder), recurrent severe, without psychosis [F33.2] 12/07/2014  . Suicidal ideation [R45.851] 12/07/2014  . Chronic post-traumatic stress disorder (PTSD) [F43.12] 12/07/2014  . Suicide attempt [T14.91]   . Anxiety attack [F41.0] 07/31/2013  . Major depression [F32.2] 08/22/2012  . LDJTTSVX(793.9) [R51] 08/22/2012   Total Time spent with patient: 25 minutes    Past Medical History:  Past Medical History  Diagnosis Date  . Asthma   . Depression   . Suicide attempt     Past Surgical History  Procedure Laterality Date  . Breast surgery    . Tubal ligation    . Cosmetic surgery      Abdomen  . Wisdom tooth extraction      Family History: History reviewed. No pertinent family history. Social History:  History  Alcohol Use  . Yes    Comment: social     History  Drug Use No    History   Social History  . Marital Status: Single    Spouse Name: N/A  . Number of Children: N/A  . Years of Education: N/A   Social History Main Topics  . Smoking status: Never Smoker   . Smokeless tobacco: Never Used  . Alcohol Use: Yes     Comment: social  . Drug Use: No  . Sexual Activity: Not on file   Other Topics Concern  . None   Social History Narrative   Additional History:    Sleep: Fair  Appetite:  Improved   Musculoskeletal: Strength & Muscle Tone: within normal limits Gait & Station: normal Patient leans: N/A   Psychiatric Specialty Exam: Physical Exam  ROS- denies nausea, denies vomiting.  Denies excessive sedation.  Blood pressure 126/71, pulse 101, temperature 98.4 F (36.9 C), temperature source Oral, resp. rate 18, height 5' 4.25" (1.632 m), weight 201 lb (91.173 kg), last menstrual period 11/14/2014.Body mass index is 34.23 kg/(m^2).  General Appearance: Well Groomed  Engineer, water::  Good  Speech:  Normal Rate  Volume:  Decreased  Mood:  Improving, still feels depressed  But better than upon admission  Affect:   More reactive, less constricted   Thought Process:  Linear  Orientation:  Other:  fully alert and attentive   Thought Content:  denies hallucinations, no delusions , not internally preoccupied   Suicidal Thoughts:  Denies  Homicidal Thoughts:  No  Memory:  recent and remote grossly intact   Judgement:  Other:  improved   Insight:  Present  Psychomotor Activity:  Normal  Concentration:  Good  Recall:  Good  Fund of Knowledge:Good  Language: Good  Akathisia:  Negative  Handed:  Right  AIMS (if indicated):     Assets:  Communication Skills Desire for Improvement Resilience  ADL's: fair   Cognition: WNL  Sleep:  Number of Hours: 6.75   Current  Medications: Current Facility-Administered Medications  Medication Dose Route Frequency Provider Last Rate Last Dose  . acetaminophen (TYLENOL) tablet 650 mg  650 mg Oral Q4H PRN Delfin Gant, NP   650 mg at 12/10/14 1214  . albuterol (PROVENTIL HFA;VENTOLIN HFA) 108 (90 BASE) MCG/ACT inhaler 1-2 puff  1-2 puff Inhalation Q6H PRN Delfin Gant, NP      . alum & mag hydroxide-simeth (MAALOX/MYLANTA) 200-200-20 MG/5ML suspension 30 mL  30 mL Oral PRN Delfin Gant, NP      . ARIPiprazole (ABILIFY) tablet 2 mg  2 mg Oral Daily Jenne Campus, MD   2 mg at 12/12/14 0828  . FLUoxetine (PROZAC) capsule 20 mg  20 mg Oral Daily Jenne Campus, MD   20 mg at 12/12/14 0829  . ibuprofen (ADVIL,MOTRIN) tablet 600 mg  600 mg Oral Q8H PRN Encarnacion Slates, NP      . lisinopril (PRINIVIL,ZESTRIL) tablet 10 mg  10 mg Oral Daily Kerrie Buffalo, NP   10 mg at 12/12/14 0829  . loratadine (CLARITIN) tablet 10 mg  10 mg Oral Daily Encarnacion Slates, NP   10 mg at 12/12/14 0829  . LORazepam (ATIVAN) tablet 1 mg  1 mg Oral Q6H PRN Jenne Campus, MD      . ondansetron (ZOFRAN) tablet 4 mg  4 mg Oral Q8H PRN Delfin Gant, NP      . prazosin (MINIPRESS) capsule 1 mg  1 mg Oral QHS Jenne Campus, MD   1 mg at 12/11/14 2113  . traZODone (DESYREL) tablet 100 mg  100 mg Oral QHS PRN Jenne Campus, MD        Lab Results:  Results for orders placed or performed during the hospital encounter of 12/07/14 (from the past 48 hour(s))  TSH     Status: None   Collection Time: 12/10/14  7:16 PM  Result Value Ref Range   TSH 1.656 0.350 - 4.500 uIU/mL    Comment: Performed at [facility]    Physical Findings: AIMS: Facial and Oral Movements Muscles of Facial Expression: None, normal Lips and Perioral Area: None, normal Jaw: None, normal Tongue: None, normal,Extremity Movements Upper (arms, wrists, hands, fingers): None, normal Lower (legs, knees, ankles, toes): None, normal,  Trunk Movements Neck, shoulders, hips: None, normal, Overall Severity Severity of abnormal movements (highest score from questions above): None, normal Incapacitation due to abnormal movements: None, normal Patient's awareness of abnormal movements (rate only patient's report): No Awareness, Dental Status Current problems with teeth and/or dentures?: No Does patient usually wear dentures?: No  CIWA:    COWS:      Assessment-  Depression improving- at this time presenting with improved range of affect. PTSD symptoms also partially improved, and decreased nightmares on low minipress dose . Sleep had been fair , but has improved, possibly related to management of nasal allergies  with Claritin. She is starting to be more future oriented, and working on disposition plans .   Treatment Plan Summary: Daily contact with patient to assess and evaluate symptoms and progress in treatment, Medication management, Plan ongoing inpatient treatment and medications as below Continue Prozac 20 mgrs QDAY for depression, PTSD Continue Abilify 2 mgrs QDAY as antidepressant augmentation Continue Minipress 1 mgr QHS to address PTSD related nightmares , disrupted sleep Continue Trazodone  Now at 100  mgrs QHS PRN for Insomnia Continue Ativan PRNs for severe anxiety  Continue Zestril for HTN, Claritin 10 mg for allergies, motrin 600 mg for headache as needed    Medical Decision Making:  Established Problem, Stable/Improving (1), Review of Psycho-Social Stressors (1), Review or order clinical lab tests (1) and Review of Medication Regimen & Side Effects (2)  Rex Oesterle, PMHNP, FNP-BC 12/12/2014, 12:37 PM

## 2014-12-12 NOTE — Progress Notes (Signed)
D:Patient in the dayroom on approach.  Patient states she had a much better day.  Patient states she is supposed to be discharged soon.  Patient is brighter today and states she was able to speak to a social-worker about her being out of work.  Patient denies SI/HI and denies AVH. A: Staff to monitor Q 15 mins for safety.  Encouragement and support offered.  Scheduled medications administered per orders. Ativan administered prn for anxiety. R: Patient remains safe on the unit.  Patient attended group tonight.  Patient visible on the unit and interacting with peers.  Patient taking administered medications.

## 2014-12-13 MED ORDER — ARIPIPRAZOLE 2 MG PO TABS: 2.0000 mg | ORAL_TABLET | Freq: Every day | ORAL | Status: DC

## 2014-12-13 MED ORDER — TRAZODONE HCL 100 MG PO TABS: 100.0000 mg | ORAL_TABLET | Freq: Every evening | ORAL | Status: DC | PRN

## 2014-12-13 MED ORDER — LISINOPRIL 10 MG PO TABS: 10.0000 mg | ORAL_TABLET | Freq: Every day | ORAL | Status: DC

## 2014-12-13 MED ORDER — FLUOXETINE HCL 20 MG PO CAPS: 20.0000 mg | ORAL_CAPSULE | Freq: Every day | ORAL | Status: DC

## 2014-12-13 MED ORDER — LORATADINE 10 MG PO TABS: 10.0000 mg | ORAL_TABLET | Freq: Every day | ORAL | Status: DC

## 2014-12-13 MED ORDER — PRAZOSIN HCL 1 MG PO CAPS: 1.0000 mg | ORAL_CAPSULE | Freq: Every day | ORAL | Status: DC

## 2014-12-13 NOTE — [facility] Suicide Risk Assessment (Signed)
[facility] Discharge Suicide Risk Assessment   Demographic Factors:  44 year old female , lives with daughter   Total Time spent with patient: 30 minutes  Musculoskeletal: Strength & Muscle Tone: within normal limits Gait & Station: normal Patient leans: N/A  Psychiatric Specialty Exam: Physical Exam  ROS  Blood pressure 118/96, pulse 105, temperature 98.4 F (36.9 C), temperature source Oral, resp. rate 18, height 5' 4.25" (1.632 m), weight 201 lb (91.173 kg), last menstrual period 11/14/2014.Body mass index is 34.23 kg/(m^2).  General Appearance: improved grooming  Eye Contact::  improved   Speech:  Normal Rate409  Volume:  Normal  Mood:  less depressed, states she feels better   Affect:  Appropriate and more reactive   Thought Process:  Goal Directed and Linear  Orientation:  Full (Time, Place, and Person)  Thought Content:  denies halllucinations, no delusions  Suicidal Thoughts:  No  Homicidal Thoughts:  No  Memory:  recent and remote grossly intact   Judgement:  Other:  improved  Insight:  improved   Psychomotor Activity:  Normal  Concentration:  Good  Recall:  Good  Fund of Knowledge:Good  Language: Good  Akathisia:  Negative  Handed:  Right  AIMS (if indicated):     Assets:  Communication Skills Desire for Improvement Resilience  Sleep:  Number of Hours: 6  Cognition: WNL  ADL's: improved    Have you used any form of tobacco in the last 30 days? (Cigarettes, Smokeless Tobacco, Cigars, and/or Pipes): Patient Refused Screening  Has this patient used any form of tobacco in the last 30 days? (Cigarettes, Smokeless Tobacco, Cigars, and/or Pipes) No  Mental Status Per Nursing Assessment::   On Admission:  NA  Current Mental Status by Physician: At this time patient is improved compared to admission- less depressed , affect more reactive, still somewhat constricted, no thought disorder, no SI or HI, no psychotic symptoms  Loss Factors: Relationship stressors ,  family stressors, having difficulty with son, who had been engaging in criminal activity  Historical Factors: History of depression  Risk Reduction Factors:   Responsible for children under 38 years of age, Sense of responsibility to family, Living with another person, especially a relative and Positive coping skills or problem solving skills  Continued Clinical Symptoms:  Although much improved overall, some ongoing affective constriction noted, she states, however, she feels much better and at this time no SI or HI  Cognitive Features That Contribute To Risk:  No gross cognitive deficits noted upon discharge. Is alert , attentive, and oriented x 3   Suicide Risk:  Mild:  Suicidal ideation of limited frequency, intensity, duration, and specificity.  There are no identifiable plans, no associated intent, mild dysphoria and related symptoms, good self-control (both objective and subjective assessment), few other risk factors, and identifiable protective factors, including available and accessible social support.  Principal Problem: MDD (major depressive disorder), recurrent severe, without psychosis Discharge Diagnoses:  Patient Active Problem List   Diagnosis Date Noted  . MDD (major depressive disorder), recurrent severe, without psychosis [F33.2] 12/07/2014  . Suicidal ideation [R45.851] 12/07/2014  . Chronic post-traumatic stress disorder (PTSD) [F43.12] 12/07/2014  . Suicide attempt [T14.91]   . Anxiety attack [F41.0] 07/31/2013  . Major depression [F32.2] 08/22/2012  . Headache(784.0) [R51] 08/22/2012    Follow-up Information    Follow up with [facility]    . Call today.   Why:  For follow up if needed for medical treatment and refills  of blood pressure medication Lisinopril    Contact information:   [address]   [address] [phone]      Follow up with [facility].   Specialty:  Behavioral Health   Why:   Please walk in between 8am-3pm Monday-Friday to be set up with a doctor for medication management.   Contact information:   [place] [address] [phone]       Follow up with [facility] On 12/15/2014.   Why:  at 12:00pm for therapy.    Contact information:   [facility] [address] [place] Phone: [phone]      Plan Of Care/Follow-up recommendations:  Activity:  as tolerated  Diet:  Regular Tests:  NA Other:  see below  Is patient on multiple antipsychotic therapies at discharge:  No   Has Patient had three or more failed trials of antipsychotic monotherapy by history:  No  Recommended Plan for Multiple Antipsychotic Therapies: NA  Patient is leaving in good spirits. Plans to return home. Follow up as above .   Bobbi Kozakiewicz 12/13/2014, 1:40 PM
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
## 2014-12-13 NOTE — Plan of Care (Signed)
Problem: Diagnosis: Increased Risk For Suicide Attempt Goal: LTG-Patient Will Report Improved Mood and Deny Suicidal LTG (by discharge) Patient will report improved mood and deny suicidal ideation.  Outcome: Progressing Patient denies SI.  Patient verbally contracts for safety.
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
## 2014-12-13 NOTE — Progress Notes (Signed)
Patient ID: Jane Snyder, female   DOB: 1970/12/24, 44 y.o.   MRN: [id number]  Pt currently presents with a flat affect and cooperative behavior. Per self inventory, pt rates depression at a 1, hopelessness 0 and anxiety 3. Pt's daily goal is to "going home" and they intend to do so by "make sure all plans are in place with dr and sw." Pt reports good sleep, good concentration, normal energy and a good appetite. Pt reports not eating breakfast this morning due to decreased appetite, pt c/o nausea this afternoon. Pt given snack, reports relief.   Pt provided with medications per providers orders. Pt's labs and vitals were monitored throughout the day. Pt supported emotionally and encouraged to express concerns and questions. Pt educated on medications and diet/nutrition. Pt given medication, cold pack and adjusted the environment today for headache relief.   Pt's safety ensured with 15 minute and environmental checks. Pt currently denies SI/HI and A/V hallucinations. Pt verbally agrees to seek staff if SI/HI or A/VH occurs and to consult with staff before acting on these thoughts. Will continue POC. Pt to be discharged today. Pt states "I'm ready to go home, my baby is going to come pick me up."

## 2014-12-13 NOTE — Progress Notes (Signed)
Patient ID: Jane Snyder, female   DOB: 03/19/1971, 44 y.o.   MRN: [id number] Adult Psychoeducational Group Note  Date:  12/13/2014 Time:  0900am  Group Topic/Focus:  Diagnosis Education:   The focus of this group is to discuss the major disorders that patients maybe diagnosed with.  Group discusses the importance of knowing what one's diagnosis is so that one can understand treatment and better advocate for oneself.  Participation Level:  Active  Participation Quality:  Appropriate  Affect:  Appropriate  Cognitive:  Alert and Oriented  Insight: Good  Engagement in Group:  Engaged  Modes of Intervention:  Clarification, Discussion, Education, Orientation and Support  Additional Comments:  Pt able to identify one daily goal to accomplish today.   [name] 12/13/2014, 12:59 PM

## 2014-12-13 NOTE — Progress Notes (Signed)
Patient ID: Jane Snyder, female   DOB: [birth year], 44 y.o.   MRN: [id number]  Pt discharged home with her daughter. Pt was appreciative and stable at the time of discharge. All papers and prescriptions were given and valuables returned. Verbal understanding expressed. Denies SI/HI and A/VH. Pt given opportunity to express concerns and ask questions.

## 2014-12-13 NOTE — Progress Notes (Signed)
  [facility] Adult Case Management Discharge Plan :  Will you be returning to the same living situation after discharge:  Yes,  Pt returning home At discharge, do you have transportation home?: Yes,  Pt's daughter provided transportation Do you have the ability to pay for your medications: Yes,  Pt provided with a supply as well as prescriptions  Release of information consent forms completed and in the chart;  Patient's signature needed at discharge.  Patient to Follow up at: Follow-up Information    Follow up with [facility]    . Call today.   Why:  For follow up if needed for medical treatment and refills of blood pressure medication Lisinopril    Contact information:   [address] [place] [address] [phone]      Follow up with [facility].   Specialty:  Behavioral Health   Why:  Please walk in between 8am-3pm Monday-Friday to be set up with a doctor for medication management.   Contact information:   [place] [address] [phone]       Follow up with [facility] On 12/15/2014.   Why:  at 12:00pm for therapy.    Contact information:   [facility] [address] [place], [place] Phone: [phone]      Patient denies SI/HI: Yes,  Pt denies    Safety Planning and Suicide Prevention discussed: Yes,  with Pt. Declined family contact  Have you used any form of tobacco in the last 30 days? (Cigarettes, Smokeless Tobacco, Cigars, and/or Pipes): Patient Refused Screening  Has patient been referred to the Quitline?: Patient refused referral  Bo Mcclintock 12/13/2014, 3:39 PM

## 2015-01-08 ENCOUNTER — Emergency Department (HOSPITAL_COMMUNITY)
Admission: EM | Admit: 2015-01-08 | Discharge: 2015-01-08 | Disposition: A | Discharge status: Home / Self Care | Payer: PRIVATE HEALTH INSURANCE | Attending: Emergency Medicine | Admitting: Emergency Medicine

## 2015-01-08 ENCOUNTER — Encounter (HOSPITAL_COMMUNITY): Payer: Self-pay

## 2015-01-08 DIAGNOSIS — I10 Essential (primary) hypertension: Secondary | ICD-10-CM | POA: Insufficient documentation

## 2015-01-08 DIAGNOSIS — J45901 Unspecified asthma with (acute) exacerbation: Secondary | ICD-10-CM | POA: Insufficient documentation

## 2015-01-08 DIAGNOSIS — R51 Headache: Secondary | ICD-10-CM | POA: Insufficient documentation

## 2015-01-08 DIAGNOSIS — R0602 Shortness of breath: Secondary | ICD-10-CM

## 2015-01-08 DIAGNOSIS — R079 Chest pain, unspecified: Secondary | ICD-10-CM | POA: Insufficient documentation

## 2015-01-08 DIAGNOSIS — Z79899 Other long term (current) drug therapy: Secondary | ICD-10-CM | POA: Insufficient documentation

## 2015-01-08 DIAGNOSIS — R42 Dizziness and giddiness: Secondary | ICD-10-CM | POA: Insufficient documentation

## 2015-01-08 HISTORY — DX: Essential (primary) hypertension: I10

## 2015-01-08 MED ORDER — ACETAMINOPHEN 325 MG PO TABS
650.0000 mg | ORAL_TABLET | Freq: Once | ORAL | Status: AC
  Administered 2015-01-08: 650 mg via ORAL
  Filled 2015-01-08: qty 2

## 2015-01-08 MED ORDER — SODIUM CHLORIDE 0.9 % IV BOLUS (SEPSIS): 1000.0000 mL | Freq: Once | INTRAVENOUS | Status: DC

## 2015-01-08 NOTE — ED Notes (Signed)
[facility]- pt coming from home with chest pain and shortness of breath. Pt thinks she got to hot outside. Dizziness with the chest pain as well as headache. Pt had 324 ASA and 1 nitro tablet en route. Slightly sinus tach on the monitor at 104. Hx of HTN and Asthma.

## 2015-01-08 NOTE — Discharge Instructions (Signed)
-   Use rescue inhaler as needed for shortness of breath - Increase fluid intake - Establish care with a PCP for follow up  - Return to ED with recurrent shortness of breath, chest pain, lightheadedness, passing out, fevers, or further worsening of symptoms

## 2015-01-08 NOTE — ED Provider Notes (Signed)
CSN: [id number]     Arrival date & time 01/08/15  1514 History   First MD Initiated Contact with Patient 01/08/15 1518     Chief Complaint  Patient presents with  . Chest Pain  . Shortness of Breath    HPI  Jane Snyder is a 44 year old female with a PMHx of asthma and hypertension presenting with shortness of breath, dizziness and chest pain. She was at an outdoor fair today and began having shortness of breath. She states that summer time triggers her asthma so she took 2 puffs of her rescue inhaler. She then got lightheaded and had to sit down because she felt like she might pass out. She then developed generalized chest pain which she describes as feeling that she couldn't get enough air in. She took 2 more puffs of her albuterol and moved into an air conditioned building. She reports feeling better inside. She was brought by EMS to the emergency department. She currently denies shortness of breath, dizziness and chest pain. She endorses a moderate headache and feeling fatigued. She reports that this type of event has happened multiple times requiring ED visits for asthma symptoms and lightheadedness. She does not have a PCP currently following her. Denies fevers, changes in vision, heart palpitations, abdominal pain, nausea, vomiting, diarrhea or muscles aches.   Past Medical History  Diagnosis Date  . Asthma   . Depression   . Suicide attempt   . Hypertension    Past Surgical History  Procedure Laterality Date  . Breast surgery    . Tubal ligation    . Cosmetic surgery      Abdomen  . Wisdom tooth extraction     History reviewed. No pertinent family history. Social History  Substance Use Topics  . Smoking status: Never Smoker   . Smokeless tobacco: Never Used  . Alcohol Use: Yes     Comment: social   OB History    No data available     Review of Systems  Constitutional: Positive for fatigue. Negative for fever and chills.  Eyes: Negative for visual disturbance.   Respiratory: Positive for chest tightness and shortness of breath. Negative for cough and wheezing.   Cardiovascular: Positive for chest pain. Negative for palpitations.  Gastrointestinal: Negative for nausea, vomiting, abdominal pain and diarrhea.  Musculoskeletal: Negative for myalgias.  Neurological: Positive for dizziness, light-headedness and headaches. Negative for syncope.      Allergies  Review of patient's allergies indicates no known allergies.  Home Medications   Prior to Admission medications   Medication Sig Start Date End Date Taking? Authorizing Provider  acetaminophen (TYLENOL) 325 MG tablet Take 650 mg by mouth every 6 (six) hours as needed for mild pain.   Yes Historical Provider, MD  albuterol (PROVENTIL HFA;VENTOLIN HFA) 108 (90 BASE) MCG/ACT inhaler Inhale 1-2 puffs into the lungs every 6 (six) hours as needed for wheezing or shortness of breath.   Yes Historical Provider, MD  ARIPiprazole (ABILIFY) 2 MG tablet Take 1 tablet (2 mg total) by mouth daily. 12/13/14  Yes Niel Hummer, NP  FLUoxetine (PROZAC) 20 MG capsule Take 1 capsule (20 mg total) by mouth daily. 12/13/14  Yes Niel Hummer, NP  lisinopril (PRINIVIL,ZESTRIL) 10 MG tablet Take 1 tablet (10 mg total) by mouth daily. 12/13/14  Yes Niel Hummer, NP  loratadine (CLARITIN) 10 MG tablet Take 1 tablet (10 mg total) by mouth daily. May purchase over the counter for treatment of allergies. Patient taking  differently: Take 10 mg by mouth daily as needed for allergies. May purchase over the counter for treatment of allergies. 12/13/14  Yes Niel Hummer, NP  prazosin (MINIPRESS) 1 MG capsule Take 1 capsule (1 mg total) by mouth at bedtime. 12/13/14  Yes Niel Hummer, NP  traZODone (DESYREL) 100 MG tablet Take 1 tablet (100 mg total) by mouth at bedtime as needed for sleep (May repeat x1). 12/13/14  Yes Niel Hummer, NP   BP 141/87 mmHg  Pulse 80  Temp(Src) 98.8 F (37.1 C) (Oral)  Resp 20  Ht 5\' 4"  (1.626 m)   Wt 196 lb (88.905 kg)  BMI 33.63 kg/m2  SpO2 99%  LMP 01/08/2015 Physical Exam  Constitutional: She is oriented to person, place, and time. She appears well-developed and well-nourished. No distress.  HENT:  Head: Normocephalic and atraumatic.  Eyes: Conjunctivae are normal. Pupils are equal, round, and reactive to light.  Cardiovascular: Normal rate, regular rhythm and normal heart sounds.   Pulmonary/Chest: Breath sounds normal. No respiratory distress. She has no wheezes.  Abdominal: Soft. She exhibits no distension. There is no tenderness.  Neurological: She is alert and oriented to person, place, and time.  Skin: Skin is warm and dry.  Psychiatric: She has a normal mood and affect.    ED Course  Procedures (including critical care time) Labs Review Labs Reviewed - No data to display    EKG Interpretation   Date/Time:  Sunday January 08 2015 15:19:51 EDT Ventricular Rate:  101 PR Interval:  122 QRS Duration: 87 QT Interval:  435 QTC Calculation: 564 R Axis:   88 Text Interpretation:  Sinus tachycardia Borderline T wave abnormalities  Prolonged QT interval Baseline wander in lead(s) I III aVR aVL Confirmed  by Jeneen Rinks  MD, [place] ([id number]) on 01/08/2015 3:29:14 PM Also confirmed by Jeneen Rinks   MD, [place] ([id number])  on 01/08/2015 3:54:47 PM      MDM   Final diagnoses:  Shortness of breath  Chest pain, unspecified chest pain type  Lightheadedness    1. Shortness of breath, chest pain - Shortness of breath & CP sx controlled with her rescue inhaler before arrival - SpO2 97-100 in ED - Complaining of headache in ED, given tylenol 650 mg - Encouraged PO fluids while in ED - Pt reports full symptom resolution and feels she is ready to go home - Encouraged her to establish care with PCP for asthma follow up - Return to ED with syncope, chest pains, shortness of breath or further worsening of symptoms     Josephina Gip, PA-C 01/08/15 2018  Tanna Furry, MD 01/17/15 2143

## 2015-05-25 ENCOUNTER — Telehealth: Payer: Self-pay

## 2015-07-31 ENCOUNTER — Other Ambulatory Visit (HOSPITAL_COMMUNITY): Payer: Self-pay | Admitting: Obstetrics

## 2015-07-31 DIAGNOSIS — N938 Other specified abnormal uterine and vaginal bleeding: Secondary | ICD-10-CM

## 2015-07-31 DIAGNOSIS — D259 Leiomyoma of uterus, unspecified: Secondary | ICD-10-CM

## 2015-08-07 ENCOUNTER — Ambulatory Visit (HOSPITAL_COMMUNITY)
Admission: RE | Admit: 2015-08-07 | Discharge: 2015-08-07 | Disposition: A | Discharge status: Home / Self Care | Payer: BLUE CROSS/BLUE SHIELD | Source: Ambulatory Visit | Attending: Obstetrics | Admitting: Obstetrics

## 2015-08-07 DIAGNOSIS — N938 Other specified abnormal uterine and vaginal bleeding: Secondary | ICD-10-CM

## 2015-08-07 DIAGNOSIS — D251 Intramural leiomyoma of uterus: Secondary | ICD-10-CM | POA: Insufficient documentation

## 2015-08-07 DIAGNOSIS — D250 Submucous leiomyoma of uterus: Secondary | ICD-10-CM | POA: Insufficient documentation

## 2015-08-07 DIAGNOSIS — D259 Leiomyoma of uterus, unspecified: Secondary | ICD-10-CM

## 2015-08-14 ENCOUNTER — Other Ambulatory Visit: Payer: Self-pay | Admitting: Obstetrics

## 2015-08-25 NOTE — Patient Instructions (Signed)
Your procedure is scheduled on:  Wednesday, September 13, 2015  Enter through the [employer] [facility] at:  7:00 AM  Pick up the phone at the desk and dial [phone].  Call this number if you have problems the morning of surgery: [phone].  Remember: Do NOT eat food or drink after:  Midnight Tuesday  Take these medicines the morning of surgery with a SIP OF WATER:  Losartan  Bring Asthma Inhaler day of surgery  Do NOT wear jewelry (body piercing), metal hair clips/bobby pins, make-up, or nail polish. Do NOT wear lotions, powders, or perfumes.  You may wear deodorant. Do NOT shave for 48 hours prior to surgery. Do NOT bring valuables to the hospital. Contacts, dentures, or bridgework may not be worn into surgery.  Have a responsible adult drive you home and stay with you for 24 hours after your procedure

## 2015-08-28 ENCOUNTER — Encounter (HOSPITAL_COMMUNITY): Payer: Self-pay

## 2015-08-28 ENCOUNTER — Encounter (HOSPITAL_COMMUNITY)
Admission: RE | Admit: 2015-08-28 | Discharge: 2015-08-28 | Disposition: A | Discharge status: Home / Self Care | Payer: BLUE CROSS/BLUE SHIELD | Source: Ambulatory Visit | Attending: Obstetrics | Admitting: Obstetrics

## 2015-08-28 DIAGNOSIS — Z01812 Encounter for preprocedural laboratory examination: Secondary | ICD-10-CM | POA: Diagnosis not present

## 2015-08-28 HISTORY — DX: Headache: R51

## 2015-08-28 HISTORY — DX: Asymptomatic varicose veins of unspecified lower extremity: I83.90

## 2015-08-28 HISTORY — DX: Headache, unspecified: R51.9

## 2015-08-28 LAB — BASIC METABOLIC PANEL
Anion gap: 8 (ref 5–15)
BUN: 14 mg/dL (ref 6–20)
CALCIUM: 8.7 mg/dL — AB (ref 8.9–10.3)
CHLORIDE: 98 mmol/L — AB (ref 101–111)
CO2: 27 mmol/L (ref 22–32)
CREATININE: 0.87 mg/dL (ref 0.44–1.00)
GFR calc non Af Amer: >60 mL/min (ref >60)
GLUCOSE: 100 mg/dL — AB (ref 65–99)
Potassium: 3.7 mmol/L (ref 3.5–5.1)
Sodium: 133 mmol/L — ABNORMAL LOW (ref 135–145)

## 2015-08-28 LAB — CBC
HEMATOCRIT: 35.2 % — AB (ref 36.0–46.0)
HEMOGLOBIN: 11.7 g/dL — AB (ref 12.0–15.0)
MCH: 26.7 pg (ref 26.0–34.0)
MCHC: 33.2 g/dL (ref 30.0–36.0)
MCV: 80.2 fL (ref 78.0–100.0)
Platelets: 294 10*3/uL (ref 150–400)
RBC: 4.39 MIL/uL (ref 3.87–5.11)
RDW: 14.3 % (ref 11.5–15.5)
WBC: 10 10*3/uL (ref 4.0–10.5)

## 2015-09-06 NOTE — H&P (Signed)
NAME:  LASHANIQUE, GARCIAGARCIA NO.:  MEDICAL RECORD NO.:  [id number]  LOCATION:                                 FACILITY:  PHYSICIAN:  Frederico Hamman, M.D.DATE OF BIRTH:  1971-02-07  DATE OF ADMISSION: DATE OF DISCHARGE:                             HISTORY & PHYSICAL   Date of surgery is September 13, 2015.  The patient is a 45 year old, gravida 3, para 3-0-3, who complains of her periods being heavy for the past year.  She __________ done in 2011.  PAST MEDICAL HISTORY:  She has high blood pressure which she did know about and she was referred for treatment.  The patient is to have a hysteroscopy, D and C, and __________repeat.  PAST SURGICAL HISTORY:  Negative except for hysteroscopy, D and C, __________in 2011.  SOCIAL HISTORY:  Nonsmoker and nondrinker.  SYSTEM REVIEW:  Negative.  PHYSICAL EXAM:  GENERAL:  Well-developed female, in no distress. HEENT:  Negative. LUNGS:  Clear to P and A. HEART:  Regular rhythm.  No murmurs, no gallops.  BREASTS:  No masses. ABDOMEN:  Negative. PELVIC:  Uterus, small myomas present, adnexa negative, Pap smear negative. EXTREMITIES:  Negative.          ______________________________ Frederico Hamman, M.D.     BAM/MEDQ  D:  09/05/2015  T:  09/06/2015  Job:  [id number]

## 2015-09-06 NOTE — Anesthesia Preprocedure Evaluation (Addendum)
Anesthesia Evaluation  Patient identified by MRN, date of birth, ID band Patient awake    Reviewed: Allergy & Precautions, NPO status , Patient's Chart, lab work & pertinent test results  Airway Mallampati: II       Dental  (+) Teeth Intact, Dental Advisory Given   Pulmonary neg pulmonary ROS, asthma (injhalers) ,    breath sounds clear to auscultation       Cardiovascular hypertension, Pt. on medications negative cardio ROS   Rhythm:Regular     Neuro/Psych  Headaches, Anxiety Depression negative neurological ROS  negative psych ROS   GI/Hepatic negative GI ROS, Neg liver ROS,   Endo/Other  negative endocrine ROSObesity BMI 35  Renal/GU negative Renal ROS   HX BTL negative genitourinary   Musculoskeletal negative musculoskeletal ROS (+)   Abdominal   Peds negative pediatric ROS (+)  Hematology negative hematology ROS (+)   Anesthesia Other Findings   Reproductive/Obstetrics negative OB ROS                          Anesthesia Physical Anesthesia Plan  ASA: II  Anesthesia Plan: General   Post-op Pain Management:    Induction: Intravenous  Airway Management Planned: LMA  Additional Equipment:   Intra-op Plan:   Post-operative Plan: Extubation in OR  Informed Consent: I have reviewed the patients History and Physical, chart, labs and discussed the procedure including the risks, benefits and alternatives for the proposed anesthesia with the patient or authorized representative who has indicated his/her understanding and acceptance.     Plan Discussed with:   Anesthesia Plan Comments: (Check am labs)       Anesthesia Quick Evaluation

## 2015-09-06 NOTE — H&P (Signed)
NAME:  Jane Snyder, Jane Snyder NO.:  MEDICAL RECORD NO.:  [id number]  LOCATION:                                 FACILITY:  PHYSICIAN:  Frederico Hamman, M.D.DATE OF BIRTH:  [birth year]  DATE OF ADMISSION: DATE OF DISCHARGE:                             HISTORY & PHYSICAL   ESTIMATED DATE OF OPERATION:  September 13, 2015.  HISTORY OF PRESENT ILLNESS:  The patient is a 45 year old, gravida 3, para 3-0-0-3, last menstrual period was July 13, 2015.  The patient says her periods are now heavy and in the past, she had HTA in 2011, so she is back for hysteroscopy, D and C, and repeat HTA.  PAST MEDICAL HISTORY:  She has hypertension.  She is not on any medication.  Ultrasound showed some small myomas.  PAST SURGICAL HISTORY:  She had like I said HTA.  SOCIAL HISTORY:  Negative.  She also had __________ in the past.  SYSTEM REVIEW:  Negative.  PHYSICAL EXAMINATION:  GENERAL:  Well-developed female, in no distress. HEENT:  Negative. LUNGS:  Clear to P and A. HEART:  Regular rhythm.  No murmurs, no gallops. BREASTS:  Negative. BIMANUAL:  Uterus top normal size, negative adnexa.  Pap negative. EXTREMITIES:  Negative.          ______________________________ Frederico Hamman, M.D.     BAM/MEDQ  D:  09/05/2015  T:  09/06/2015  Job:  [id number]

## 2015-09-13 ENCOUNTER — Ambulatory Visit (HOSPITAL_COMMUNITY): Payer: BLUE CROSS/BLUE SHIELD | Admitting: Anesthesiology

## 2015-09-13 ENCOUNTER — Encounter (HOSPITAL_COMMUNITY)
Admission: RE | Disposition: A | Discharge status: Home / Self Care | Payer: Self-pay | Source: Ambulatory Visit | Attending: Obstetrics

## 2015-09-13 ENCOUNTER — Encounter (HOSPITAL_COMMUNITY): Payer: Self-pay | Admitting: *Deleted

## 2015-09-13 ENCOUNTER — Ambulatory Visit (HOSPITAL_COMMUNITY)
Admission: RE | Admit: 2015-09-13 | Discharge: 2015-09-13 | Disposition: A | Discharge status: Home / Self Care | Payer: BLUE CROSS/BLUE SHIELD | Source: Ambulatory Visit | Attending: Obstetrics | Admitting: Obstetrics

## 2015-09-13 DIAGNOSIS — N938 Other specified abnormal uterine and vaginal bleeding: Secondary | ICD-10-CM | POA: Insufficient documentation

## 2015-09-13 DIAGNOSIS — I10 Essential (primary) hypertension: Secondary | ICD-10-CM | POA: Diagnosis not present

## 2015-09-13 DIAGNOSIS — F329 Major depressive disorder, single episode, unspecified: Secondary | ICD-10-CM | POA: Insufficient documentation

## 2015-09-13 DIAGNOSIS — F419 Anxiety disorder, unspecified: Secondary | ICD-10-CM | POA: Insufficient documentation

## 2015-09-13 DIAGNOSIS — J45909 Unspecified asthma, uncomplicated: Secondary | ICD-10-CM | POA: Diagnosis not present

## 2015-09-13 HISTORY — PX: DILITATION & CURRETTAGE/HYSTROSCOPY WITH HYDROTHERMAL ABLATION: SHX5570

## 2015-09-13 SURGERY — DILATATION & CURETTAGE/HYSTEROSCOPY WITH HYDROTHERMAL ABLATION
Anesthesia: General

## 2015-09-13 MED ORDER — EPHEDRINE 5 MG/ML INJ
INTRAVENOUS | Status: AC
  Filled 2015-09-13: qty 10

## 2015-09-13 MED ORDER — ONDANSETRON HCL 4 MG/2ML IJ SOLN
INTRAMUSCULAR | Status: AC
  Filled 2015-09-13: qty 2

## 2015-09-13 MED ORDER — SODIUM CHLORIDE 0.9 % IR SOLN
Status: DC | PRN
  Administered 2015-09-13: 3000 mL

## 2015-09-13 MED ORDER — PROPOFOL 10 MG/ML IV BOLUS
INTRAVENOUS | Status: DC | PRN
Start: 2015-09-13 — End: 2015-09-13
  Administered 2015-09-13: 200 mg via INTRAVENOUS

## 2015-09-13 MED ORDER — FENTANYL CITRATE (PF) 100 MCG/2ML IJ SOLN
INTRAMUSCULAR | Status: DC | PRN
  Administered 2015-09-13 (×2): 50 ug via INTRAVENOUS

## 2015-09-13 MED ORDER — ONDANSETRON HCL 4 MG/2ML IJ SOLN: 4.0000 mg | Freq: Four times a day (QID) | INTRAMUSCULAR | Status: DC

## 2015-09-13 MED ORDER — ACETAMINOPHEN 10 MG/ML IV SOLN: 1000.0000 mg | Freq: Once | INTRAVENOUS | Status: DC

## 2015-09-13 MED ORDER — LIDOCAINE HCL (CARDIAC) 20 MG/ML IV SOLN
INTRAVENOUS | Status: AC
  Filled 2015-09-13: qty 5

## 2015-09-13 MED ORDER — FENTANYL CITRATE (PF) 100 MCG/2ML IJ SOLN
INTRAMUSCULAR | Status: AC
  Filled 2015-09-13: qty 2

## 2015-09-13 MED ORDER — DEXAMETHASONE SODIUM PHOSPHATE 4 MG/ML IJ SOLN
INTRAMUSCULAR | Status: AC
  Filled 2015-09-13: qty 1

## 2015-09-13 MED ORDER — PROPOFOL 10 MG/ML IV BOLUS
INTRAVENOUS | Status: AC
  Filled 2015-09-13: qty 20

## 2015-09-13 MED ORDER — LACTATED RINGERS IV SOLN
INTRAVENOUS | Status: DC
  Administered 2015-09-13: 08:00:00 via INTRAVENOUS
  Administered 2015-09-13: 1000 mL via INTRAVENOUS

## 2015-09-13 MED ORDER — LIDOCAINE HCL 1 % IJ SOLN
INTRAMUSCULAR | Status: AC
  Filled 2015-09-13: qty 20

## 2015-09-13 MED ORDER — DEXAMETHASONE SODIUM PHOSPHATE 10 MG/ML IJ SOLN
INTRAMUSCULAR | Status: DC | PRN
  Administered 2015-09-13: 4 mg via INTRAVENOUS

## 2015-09-13 MED ORDER — LIDOCAINE HCL (CARDIAC) 20 MG/ML IV SOLN
INTRAVENOUS | Status: DC | PRN
  Administered 2015-09-13: 100 mg via INTRAVENOUS

## 2015-09-13 MED ORDER — EPHEDRINE SULFATE 50 MG/ML IJ SOLN
INTRAMUSCULAR | Status: DC | PRN
  Administered 2015-09-13: 5 mg via INTRAVENOUS

## 2015-09-13 MED ORDER — ONDANSETRON HCL 4 MG/2ML IJ SOLN
INTRAMUSCULAR | Status: DC | PRN
  Administered 2015-09-13: 4 mg via INTRAVENOUS

## 2015-09-13 MED ORDER — FENTANYL CITRATE (PF) 100 MCG/2ML IJ SOLN
25.0000 ug | INTRAMUSCULAR | Status: DC | PRN
  Administered 2015-09-13: 50 ug via INTRAVENOUS
  Administered 2015-09-13 (×2): 25 ug via INTRAVENOUS

## 2015-09-13 MED ORDER — SCOPOLAMINE 1 MG/3DAYS TD PT72
1.0000 | MEDICATED_PATCH | Freq: Once | TRANSDERMAL | Status: DC
  Administered 2015-09-13: 1.5 mg via TRANSDERMAL

## 2015-09-13 MED ORDER — MEPERIDINE HCL 25 MG/ML IJ SOLN: 6.2500 mg | INTRAMUSCULAR | Status: DC | PRN

## 2015-09-13 MED ORDER — KETOROLAC TROMETHAMINE 30 MG/ML IJ SOLN
INTRAMUSCULAR | Status: AC
  Filled 2015-09-13: qty 1

## 2015-09-13 MED ORDER — MIDAZOLAM HCL 2 MG/2ML IJ SOLN
INTRAMUSCULAR | Status: DC | PRN
Start: 2015-09-13 — End: 2015-09-13
  Administered 2015-09-13: 2 mg via INTRAVENOUS

## 2015-09-13 MED ORDER — MIDAZOLAM HCL 2 MG/2ML IJ SOLN
INTRAMUSCULAR | Status: AC
  Filled 2015-09-13: qty 2

## 2015-09-13 MED ORDER — SCOPOLAMINE 1 MG/3DAYS TD PT72
MEDICATED_PATCH | TRANSDERMAL | Status: AC
  Administered 2015-09-13: 1.5 mg via TRANSDERMAL
  Filled 2015-09-13: qty 1

## 2015-09-13 MED ORDER — ACETAMINOPHEN 10 MG/ML IV SOLN
1000.0000 mg | Freq: Once | INTRAVENOUS | Status: AC
  Administered 2015-09-13: 1000 mg via INTRAVENOUS
  Filled 2015-09-13: qty 100

## 2015-09-13 MED ORDER — LIDOCAINE HCL 1 % IJ SOLN
INTRAMUSCULAR | Status: DC | PRN
  Administered 2015-09-13: 10 mL

## 2015-09-13 SURGICAL SUPPLY — 12 items
CATH ROBINSON RED A/P 16FR (CATHETERS) ×3 IMPLANT
CLOTH BEACON ORANGE TIMEOUT ST (SAFETY) ×3 IMPLANT
CONTAINER PREFILL 10% NBF 60ML (FORM) ×6 IMPLANT
GLOVE BIO SURGEON STRL SZ8.5 (GLOVE) ×3 IMPLANT
GLOVE BIOGEL PI IND STRL 7.0 (GLOVE) ×1 IMPLANT
GLOVE BIOGEL PI INDICATOR 7.0 (GLOVE) ×2
GOWN STRL REUS W/TWL 2XL LVL3 (GOWN DISPOSABLE) ×3 IMPLANT
GOWN STRL REUS W/TWL LRG LVL3 (GOWN DISPOSABLE) ×3 IMPLANT
PACK VAGINAL MINOR WOMEN LF (CUSTOM PROCEDURE TRAY) ×3 IMPLANT
PAD OB MATERNITY 4.3X12.25 (PERSONAL CARE ITEMS) ×3 IMPLANT
SET GENESYS HTA PROCERVA (MISCELLANEOUS) ×3 IMPLANT
TOWEL OR 17X24 6PK STRL BLUE (TOWEL DISPOSABLE) ×6 IMPLANT

## 2015-09-13 NOTE — Discharge Instructions (Addendum)
D&C Hysterosocpy Care After Read the instructions below. Refer to this sheet in the next few weeks. These instructions provide you with general information on caring for yourself after you leave the hospital. Your caregiver may also give you specific instructions.  D&C or vacuum curettage is a minor operation. A D&C involves the stretching (dilatation) of the cervix and scraping (curettage) of the inside lining of the uterus. A vacuum curettage gently sucks out the lining and tissue in the uterus with a tube. You may have light cramping and bleeding for a couple of days to two weeks after the procedure. This procedure may be done in a hospital, outpatient clinic, or doctor's office. You may be given a drug to make you sleep (general anesthetic) or a drug that numbs the area (local anesthetic) in and around the cervix. HOME CARE INSTRUCTIONS  Do not drive for 24 hours.   Wait one week before returning to strenuous activities.   Take your temperature two times a day for 4 days and write it down. Provide these temperatures to your caregiver if they are abnormal (above 98.6 F or 37.0 C).   Avoid long periods of standing, and do no heavy lifting (more than 10 pounds), pushing or pulling.   Limit stair climbing to once or twice a day.   Take rest periods often.   You may resume your usual diet.   Drink plenty of fluids (6-8 glasses a day).   You should return to your usual bowel function. If constipation should occur, you may:   Take a mild laxative with permission from your caregiver.   Add fruit and bran to your diet.   Drink more fluids. This helps with constipation.   Take showers instead of baths until your caregiver gives you permission to take baths.   Do not go swimming or use a hot tub until your caregiver gives you permission.   Try to have someone with you or available for you the first 24 to 48 hours, especially if you had a general anesthetic.   Do not douche, use  tampons, or have intercourse until after your follow-up appointment, or when your caregiver approves.   Only take over-the-counter or prescription medicines for pain, discomfort, or fever as directed by your caregiver. Do not take aspirin. It can cause bleeding.   If a prescription was given, follow your caregiver's directions. You may be given a medicine that kills germs (antibiotic) to prevent an infection.   Keep all your follow-up appointments recommended by your caregiver.  SEEK MEDICAL CARE IF:  You have increasing cramps or pain not relieved with medication.   You develop belly (abdominal) pain which does not seem to be related to the same area of earlier cramping and pain.   You feel dizzy or feel like fainting.   You have bad smelling vaginal discharge.   You develop a rash.   You develop a reaction or allergy to your medication.  SEEK IMMEDIATE MEDICAL CARE IF:  Bleeding is heavier than a normal menstrual period.   You have an oral temperature above 100.6, not controlled by medicine.   You develop chest pain.   You develop shortness of breath.   You pass out.   You develop pain in your shoulder strap area.   You develop heavy vaginal bleeding with or without blood clots.  MAKE SURE YOU:   Understand these instructions.   Will watch your condition.   Will get help right away if you are  not doing well or get worse.  UPDATED HEALTH PRACTICES  A PAP smear is done to screen for cervical cancer.   The first PAP smear should be done at age 32.   Between ages 81 and 37, PAP smears are repeated every 2 years.   Beginning at age 59, you are advised to have a PAP smear every 3 years as long as your past 3 PAP smears have been normal.   Some women have medical problems that increase the chance of getting cervical cancer. Talk to your caregiver about these problems. It is especially important to talk to your caregiver if a new problem develops soon after your last PAP  smear. In these cases, your caregiver may recommend more frequent screening and Pap smears.   The above recommendations are the same for women who have or have not gotten the vaccine for HPV (Human Papillomavirus).   If you had a hysterectomy for a problem that was not a cancer or a condition that could lead to cancer, then you no longer need Pap smears.   If you are between ages 61 and 39, and you have had normal Pap smears going back 10 years, you no longer need Pap smears.   If you have had past treatment for cervical cancer or a condition that could lead to cancer, you need Pap smears and screening for cancer for at least 20 years after your treatment.   Continue monthly self-breast examinations. Your caregiver can provide information and instructions for self-breast examination.  Document Released: 05/10/2000 Document Re-Released: 10/31/2009 [facility] Patient Information 2011 [place]. Post Anesthesia Home Care Instructions  NO TYLENOL PRODUCTS UNTIL: 3:30 PM TODAY  Activity: Get plenty of rest for the remainder of the day. A responsible adult should stay with you for 24 hours following the procedure.  For the next 24 hours, DO NOT: -Drive a car -Paediatric nurse -Drink alcoholic beverages -Take any medication unless instructed by your physician -Make any legal decisions or sign important papers.  Meals: Start with liquid foods such as gelatin or soup. Progress to regular foods as tolerated. Avoid greasy, spicy, heavy foods. If nausea and/or vomiting occur, drink only clear liquids until the nausea and/or vomiting subsides. Call your physician if vomiting continues.  Special Instructions/Symptoms: Your throat may feel dry or sore from the anesthesia or the breathing tube placed in your throat during surgery. If this causes discomfort, gargle with warm salt water. The discomfort should disappear within 24 hours.  If you had a scopolamine patch placed behind your ear for the  management of post- operative nausea and/or vomiting:  1. The medication in the patch is effective for 72 hours, after which it should be removed.  Wrap patch in a tissue and discard in the trash. Wash hands thoroughly with soap and water. 2. You may remove the patch earlier than 72 hours if you experience unpleasant side effects which may include dry mouth, dizziness or visual disturbances. 3. Avoid touching the patch. Wash your hands with soap and water after contact with the patch.

## 2015-09-13 NOTE — H&P (Signed)
  Since the original dictation the patient was treated for a positive GC and her test of  Cure  done 418 was negative

## 2015-09-13 NOTE — Anesthesia Postprocedure Evaluation (Signed)
Anesthesia Post Note  Patient: Jane Snyder  Procedure(s) Performed: Procedure(s) (LRB): DILATATION & CURETTAGE/HYSTEROSCOPY WITH HYDROTHERMAL ABLATION (N/A)  Patient location during evaluation: PACU Anesthesia Type: General Level of consciousness: awake and alert Pain management: pain level controlled Vital Signs Assessment: post-procedure vital signs reviewed and stable Respiratory status: spontaneous breathing, nonlabored ventilation, respiratory function stable and patient connected to nasal cannula oxygen Cardiovascular status: blood pressure returned to baseline and stable Postop Assessment: no signs of nausea or vomiting Anesthetic complications: no    Last Vitals:  Filed Vitals:   09/13/15 0718  BP: 126/83  Pulse: 76  Temp: 37 C  Resp: 18    Last Pain: There were no vitals filed for this visit.               Alexis Frock

## 2015-09-13 NOTE — Anesthesia Procedure Notes (Signed)
Procedure Name: LMA Insertion Date/Time: 09/13/2015 8:27 AM Performed by: Hewitt Blade Pre-anesthesia Checklist: Patient identified, Patient being monitored, Emergency Drugs available and Suction available Patient Re-evaluated:Patient Re-evaluated prior to inductionOxygen Delivery Method: Circle system utilized Preoxygenation: Pre-oxygenation with 100% oxygen Intubation Type: IV induction LMA: LMA inserted LMA Size: 4.0 Number of attempts: 1 Placement Confirmation: breath sounds checked- equal and bilateral and positive ETCO2 Tube secured with: Tape Dental Injury: Teeth and Oropharynx as per pre-operative assessment

## 2015-09-13 NOTE — Op Note (Signed)
Preop diagnosis dysfunctional uterine bleeding Postop diagnosis hysteroscopy D&C and HTA Anesthesia general Surgeon Dr. Gracy Racer Procedure on the general anesthesia patient in the lithotomy position perineum and vagina prepped and draped bladder emptied with a straight catheter bimanual exam revealed the uterus to be slightly enlarged negative adnexa speculum placed in the vagina and the cervix injected with 10 cc of 1% Xylocaine anterior lip of the cervix grasped with tenaculum endometrial cavity sounded 8 cm cervix dilated to #25 [place]ounds and the hysteroscope inserted cavity appeared normal except for one small polyp in a couple of adhesions hysteroscope removed a sharp curettage performed small amount of tissue obtained and then they hysteroscope reinserted for day HTA procedure the cavity integrity test was passed and the fluid heated to 90C for 10 minutes and then HTA done done and after that was completed a cooldown period of 1 minute was done and the procedure terminated patient tolerated procedure well

## 2015-09-13 NOTE — Transfer of Care (Signed)
Immediate Anesthesia Transfer of Care Note  Patient: Jane Snyder  Procedure(s) Performed: Procedure(s): DILATATION & CURETTAGE/HYSTEROSCOPY WITH HYDROTHERMAL ABLATION (N/A)  Patient Location: PACU  Anesthesia Type:General  Level of Consciousness: awake, alert  and oriented  Airway & Oxygen Therapy: Patient Spontanous Breathing and Patient connected to nasal cannula oxygen  Post-op Assessment: Report given to RN, Post -op Vital signs reviewed and stable and Patient moving all extremities  Post vital signs: Reviewed and stable  Last Vitals:  Filed Vitals:   09/13/15 0718  BP: 126/83  Pulse: 76  Temp: 37 C  Resp: 18    Complications: No apparent anesthesia complications

## 2015-09-14 ENCOUNTER — Encounter (HOSPITAL_COMMUNITY): Payer: Self-pay | Admitting: Obstetrics

## 2016-02-23 LAB — PROCEDURE REPORT - SCANNED: PAP SMEAR: NEGATIVE

## 2016-09-02 ENCOUNTER — Other Ambulatory Visit (HOSPITAL_COMMUNITY)
Admission: RE | Admit: 2016-09-02 | Discharge: 2016-09-02 | Disposition: A | Discharge status: Home / Self Care | Payer: 59 | Source: Ambulatory Visit | Attending: Obstetrics | Admitting: Obstetrics

## 2016-09-02 ENCOUNTER — Ambulatory Visit (INDEPENDENT_AMBULATORY_CARE_PROVIDER_SITE_OTHER): Payer: 59 | Admitting: Obstetrics

## 2016-09-02 ENCOUNTER — Encounter: Payer: Self-pay | Admitting: Obstetrics

## 2016-09-02 VITALS — BP 169/104 | HR 72 | Temp 97.4°F | Ht 64.0 in | Wt 214.9 lb

## 2016-09-02 DIAGNOSIS — Z01419 Encounter for gynecological examination (general) (routine) without abnormal findings: Secondary | ICD-10-CM | POA: Diagnosis not present

## 2016-09-02 DIAGNOSIS — Z1239 Encounter for other screening for malignant neoplasm of breast: Secondary | ICD-10-CM

## 2016-09-02 DIAGNOSIS — D509 Iron deficiency anemia, unspecified: Secondary | ICD-10-CM

## 2016-09-02 DIAGNOSIS — D251 Intramural leiomyoma of uterus: Secondary | ICD-10-CM

## 2016-09-02 DIAGNOSIS — N946 Dysmenorrhea, unspecified: Secondary | ICD-10-CM

## 2016-09-02 DIAGNOSIS — Z0000 Encounter for general adult medical examination without abnormal findings: Secondary | ICD-10-CM

## 2016-09-02 MED ORDER — OXYCODONE-ACETAMINOPHEN 5-325 MG PO TABS: 1.0000 | ORAL_TABLET | ORAL | 0 refills | Status: DC | PRN

## 2016-09-02 MED ORDER — PRENATE MINI 18-0.6-0.4-350 MG PO CAPS: 1.0000 | ORAL_CAPSULE | Freq: Every day | ORAL | 3 refills | Status: DC

## 2016-09-02 MED ORDER — IBUPROFEN 800 MG PO TABS: 800.0000 mg | ORAL_TABLET | Freq: Three times a day (TID) | ORAL | 5 refills | Status: DC | PRN

## 2016-09-02 NOTE — Patient Instructions (Addendum)

## 2016-09-02 NOTE — Progress Notes (Signed)
Subjective:        Jane Snyder is a 46 y.o. female here for a routine exam.  Current complaints: Lower abdominal pain that radiates to both sides.  S/P second endometrial ablation ~ a year ago.  Has had spotting and lower abdominal pain with period once a month since procedure.   Personal health questionnaire:  Is patient Ashkenazi Jewish, have a family history of breast and/or ovarian cancer: no Is there a family history of uterine cancer diagnosed at age < 50, gastrointestinal cancer, urinary tract cancer, family member who is a Field seismologist syndrome-associated carrier: no Is the patient overweight and hypertensive, family history of diabetes, personal history of gestational diabetes, preeclampsia or PCOS: no Is patient over 61, have PCOS,  family history of premature CHD under age 46, diabetes, smoke, have hypertension or peripheral artery disease:  no At any time, has a partner hit, kicked or otherwise hurt or frightened you?: no Over the past 2 weeks, have you felt down, depressed or hopeless?: no Over the past 2 weeks, have you felt little interest or pleasure in doing things?:no   Gynecologic History No LMP recorded. Contraception: tubal ligation Last Pap: 2017. Results were: normal Last mammogram: ~ 8 yrs. ago. Results were: normal  Obstetric History OB History  Gravida Para Term Preterm AB Living  3 3 3  0   3  SAB TAB Ectopic Multiple Live Births               # Outcome Date GA Lbr Len/2nd Weight Sex Delivery Anes PTL Lv  3 Term           2 Term           1 Term               Past Medical History:  Diagnosis Date  . Anemia   . Asthma   . Depression   . Headache    Migraines  . Hypertension   . Suicide attempt   . Vaginal delivery 1994, 1997, 1999  . Varicose vein of leg     Past Surgical History:  Procedure Laterality Date  . BREAST SURGERY    . COSMETIC SURGERY     Abdomen  . DILATION AND CURETTAGE OF UTERUS  2011  . DILITATION &  CURRETTAGE/HYSTROSCOPY WITH HYDROTHERMAL ABLATION N/A 09/13/2015   Procedure: DILATATION & CURETTAGE/HYSTEROSCOPY WITH HYDROTHERMAL ABLATION;  Surgeon: Frederico Hamman, MD;  Location: [facility];  Service: Gynecology;  Laterality: N/A;  . TUBAL LIGATION    . WISDOM TOOTH EXTRACTION       Current Outpatient Prescriptions:  .  albuterol (PROVENTIL HFA;VENTOLIN HFA) 108 (90 BASE) MCG/ACT inhaler, Inhale 1-2 puffs into the lungs every 6 (six) hours as needed for wheezing or shortness of breath., Disp: , Rfl:  .  acetaminophen (TYLENOL) 325 MG tablet, Take 650 mg by mouth every 6 (six) hours as needed for mild pain., Disp: , Rfl:  .  diphenhydrAMINE (BENADRYL) 25 MG tablet, Take 25 mg by mouth as needed., Disp: , Rfl:  .  loratadine (CLARITIN) 10 MG tablet, Take 1 tablet (10 mg total) by mouth daily. May purchase over the counter for treatment of allergies. (Patient not taking: Reported on 09/02/2016), Disp: , Rfl:  .  losartan-hydrochlorothiazide (HYZAAR) 100-25 MG tablet, Take 1 tablet by mouth daily., Disp: , Rfl: 0 No Known Allergies  Social History  Substance Use Topics  . Smoking status: Never Smoker  . Smokeless tobacco: Never Used  .  Alcohol use Yes     Comment: social    Family History  Problem Relation Age of Onset  . Hypertension Mother   . Stroke Mother   . Asthma Brother   . Diabetes Brother   . Hypertension Brother   . Asthma Maternal Grandmother   . Cancer Maternal Grandmother   . Hypertension Maternal Grandmother       Review of Systems  Constitutional: negative for fatigue and weight loss Respiratory: negative for cough and wheezing Cardiovascular: negative for chest pain, fatigue and palpitations Gastrointestinal: negative for abdominal pain and change in bowel habits Musculoskeletal:negative for myalgias Neurological: negative for gait problems and tremors Behavioral/Psych: negative for abusive relationship, depression Endocrine: negative for temperature  intolerance    Genitourinary:negative for genital lesions, hot flashes, sexual problems and vaginal discharge.  Positive for lower abdominal pain with spotting with period Integument/breast: negative for breast lump, breast tenderness, nipple discharge and skin lesion(s)    Objective:       BP (!) 169/104 (BP Location: Left Arm, Patient Position: Sitting)   Pulse 72   Temp 97.4 F (36.3 C)   Ht 5\' 4"  (1.626 m)   Wt 214 lb 14.4 oz (97.5 kg)   BMI 36.89 kg/m  General:   alert  Skin:   no rash or abnormalities  Lungs:   clear to auscultation bilaterally  Heart:   regular rate and rhythm, S1, S2 normal, no murmur, click, rub or gallop  Breasts:   normal without suspicious masses, skin or nipple changes or axillary nodes  Abdomen:  normal findings: no organomegaly, soft, non-tender and no hernia  Pelvis:  External genitalia: normal general appearance Urinary system: urethral meatus normal and bladder without fullness, nontender Vaginal: normal without tenderness, induration or masses Cervix: normal appearance Adnexa: normal bimanual exam Uterus: anteverted and non-tender, normal size   Lab Review Urine pregnancy test Labs reviewed yes Radiologic studies reviewed yes  50% of 20 min visit spent on counseling and coordination of care.    Assessment:    Healthy female exam.    Dysmenorrhea  Uterine Fibroids, symptomatic   Plan:    Education reviewed: calcium supplements, depression evaluation, low fat, low cholesterol diet, safe sex/STD prevention, self breast exams and weight bearing exercise. Mammogram ordered. Follow up in: 1 year.   No orders of the defined types were placed in this encounter.  No orders of the defined types were placed in this encounter.     Patient ID: Jane Snyder, female   DOB: January 04, 1971, 46 y.o.   MRN: [id number]

## 2016-09-02 NOTE — Progress Notes (Signed)
Patient presents for Annual exam, she complains of cramping in lower abdomen. She was Dx with PTSD.

## 2016-09-03 LAB — CERVICOVAGINAL ANCILLARY ONLY
BACTERIAL VAGINITIS: POSITIVE — AB
CANDIDA VAGINITIS: NEGATIVE
CHLAMYDIA, DNA PROBE: NEGATIVE
NEISSERIA GONORRHEA: NEGATIVE
Trichomonas: POSITIVE — AB

## 2016-09-04 ENCOUNTER — Other Ambulatory Visit: Payer: Self-pay | Admitting: Obstetrics

## 2016-09-04 DIAGNOSIS — A5901 Trichomonal vulvovaginitis: Secondary | ICD-10-CM

## 2016-09-04 DIAGNOSIS — B9689 Other specified bacterial agents as the cause of diseases classified elsewhere: Secondary | ICD-10-CM

## 2016-09-04 DIAGNOSIS — N760 Acute vaginitis: Principal | ICD-10-CM

## 2016-09-04 LAB — CYTOLOGY - PAP
DIAGNOSIS: NEGATIVE
HPV: NOT DETECTED

## 2016-09-04 MED ORDER — TINIDAZOLE 500 MG PO TABS: 2.0000 g | ORAL_TABLET | Freq: Every day | ORAL | 0 refills | Status: DC

## 2016-09-25 ENCOUNTER — Ambulatory Visit
Admission: RE | Admit: 2016-09-25 | Discharge: 2016-09-25 | Disposition: A | Discharge status: Home / Self Care | Payer: 59 | Source: Ambulatory Visit | Attending: Obstetrics | Admitting: Obstetrics

## 2016-09-25 DIAGNOSIS — Z1239 Encounter for other screening for malignant neoplasm of breast: Secondary | ICD-10-CM

## 2016-10-18 ENCOUNTER — Encounter: Payer: Self-pay | Admitting: Physician Assistant

## 2016-10-24 ENCOUNTER — Ambulatory Visit (INDEPENDENT_AMBULATORY_CARE_PROVIDER_SITE_OTHER): Payer: 59 | Admitting: Physician Assistant

## 2016-10-24 ENCOUNTER — Encounter: Payer: Self-pay | Admitting: Physician Assistant

## 2016-10-24 VITALS — BP 134/90 | HR 69 | Temp 98.3°F | Resp 16 | Ht 64.0 in | Wt 215.8 lb

## 2016-10-24 DIAGNOSIS — F4312 Post-traumatic stress disorder, chronic: Secondary | ICD-10-CM

## 2016-10-24 DIAGNOSIS — G8929 Other chronic pain: Secondary | ICD-10-CM | POA: Diagnosis not present

## 2016-10-24 DIAGNOSIS — Z113 Encounter for screening for infections with a predominantly sexual mode of transmission: Secondary | ICD-10-CM | POA: Diagnosis not present

## 2016-10-24 DIAGNOSIS — F332 Major depressive disorder, recurrent severe without psychotic features: Secondary | ICD-10-CM

## 2016-10-24 DIAGNOSIS — Z23 Encounter for immunization: Secondary | ICD-10-CM

## 2016-10-24 DIAGNOSIS — J45909 Unspecified asthma, uncomplicated: Secondary | ICD-10-CM | POA: Diagnosis not present

## 2016-10-24 DIAGNOSIS — M25561 Pain in right knee: Secondary | ICD-10-CM | POA: Insufficient documentation

## 2016-10-24 DIAGNOSIS — F321 Major depressive disorder, single episode, moderate: Secondary | ICD-10-CM

## 2016-10-24 DIAGNOSIS — Z0000 Encounter for general adult medical examination without abnormal findings: Secondary | ICD-10-CM

## 2016-10-24 DIAGNOSIS — Z6837 Body mass index (BMI) 37.0-37.9, adult: Secondary | ICD-10-CM | POA: Diagnosis not present

## 2016-10-24 DIAGNOSIS — Z7689 Persons encountering health services in other specified circumstances: Secondary | ICD-10-CM

## 2016-10-24 MED ORDER — ALBUTEROL SULFATE HFA 108 (90 BASE) MCG/ACT IN AERS: 1.0000 | INHALATION_SPRAY | Freq: Four times a day (QID) | RESPIRATORY_TRACT | 2 refills | Status: DC | PRN
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
## 2016-10-24 NOTE — Patient Instructions (Addendum)
For therapy -- [facility] ([address] [place]) - [phone] [facility]) - [facility] - [phone] [facility] [place] - [phone] [facility]  - [phone] (do not file insurance)   IF you received an x-ray today, you will receive an invoice from [facility]. Please contact [facility] at [phone] with questions or concerns regarding your invoice.   IF you received labwork today, you will receive an invoice from [facility]. Please contact [facility] at [phone] with questions or concerns regarding your invoice.   Our billing staff will not be able to assist you with questions regarding bills from these companies.  You will be contacted with the lab results as soon as they are available. The fastest way to get your results is to activate your My Chart account. Instructions are located on the last page of this paperwork. If you have not heard from [place] regarding the results in 2 weeks, please contact this office.    Keeping You Healthy  Get These Tests 1. Blood Pressure- Have your blood pressure checked once a year by your health care provider.  Normal blood pressure is 120/80. 2. Weight- Have your body mass index (BMI) calculated to screen for obesity.  BMI is measure of body fat based on height and weight.  You can also calculate your own BMI at GravelBags.it. 3. Cholesterol- Have your cholesterol checked every 5 years starting at age 56 then yearly starting at age 10. 37. Chlamydia, HIV, and other sexually transmitted diseases- Get screened every year until age 22, then within three months of each new sexual provider. 5. Pap Test - Every 1-5 years; discuss with your health care provider. 6. Mammogram- Every 1-2 years starting at age 78--50  Take these  medicines  Calcium with Vitamin D-Your body needs 1200 mg of Calcium each day and [phone] IU of Vitamin D daily.  Your body can only absorb 500 mg of Calcium at a time so Calcium must be taken in 2 or 3 divided doses throughout the day.  Multivitamin with folic acid- Once daily if it is possible for you to become pregnant.  Get these Immunizations  Gardasil-Series of three doses; prevents HPV related illness such as genital warts and cervical cancer.  Menactra-Single dose; prevents meningitis.  Tetanus shot- Every 10 years.  Flu shot-Every year.  Take these steps 1. Do not smoke-Your healthcare provider can help you quit.  For tips on how to quit go to www.smokefree.gov or call 1-800 QUITNOW. 2. Be physically active- Exercise 5 days a week for at least 30 minutes.  If you are not already physically active, start slow and gradually work up to 30 minutes of moderate physical activity.  Examples of moderate activity include walking briskly, dancing, swimming, bicycling, etc. 3. Breast Cancer- A self breast exam every month is important for early detection of breast cancer.  For more information and instruction on self breast exams, ask your healthcare provider or https://www.patel.info/. 4. Eat a healthy diet- Eat a variety of healthy foods such as fruits, vegetables, whole grains, low fat milk, low fat cheeses, yogurt, lean meats, poultry and fish, beans, nuts, tofu, etc.  For more information go to www. Thenutritionsource.org 5. Drink alcohol in moderation- Limit alcohol intake to one drink or less per day. Never drink and drive. 6. Depression- Your emotional health is as important as your physical health.  If you're feeling  down or losing interest in things you normally enjoy please talk to your healthcare provider about being screened for depression. 7. Dental visit- Brush and floss your teeth twice daily; visit your dentist twice a year. 8. Eye doctor- Get an eye exam  at least every 2 years. 9. Helmet use- Always wear a helmet when riding a bicycle, motorcycle, rollerblading or skateboarding. 26. Safe sex- If you may be exposed to sexually transmitted infections, use a condom. 11. Seat belts- Seat belts can save your live; always wear one. 12. Smoke/Carbon Monoxide detectors- These detectors need to be installed on the appropriate level of your home. Replace batteries at least once a year. 13. Skin cancer- When out in the sun please cover up and use sunscreen 15 SPF or higher. 14. Violence- If anyone is threatening or hurting you, please tell your healthcare provider.

## 2016-10-25 ENCOUNTER — Encounter: Payer: Self-pay | Admitting: Physician Assistant

## 2016-10-25 LAB — HIV ANTIBODY (ROUTINE TESTING W REFLEX): HIV SCREEN 4TH GENERATION: NONREACTIVE

## 2016-10-25 LAB — GC/CHLAMYDIA PROBE AMP
Chlamydia trachomatis, NAA: NEGATIVE
Neisseria gonorrhoeae by PCR: NEGATIVE

## 2016-10-25 LAB — HEPATITIS B SURFACE ANTIGEN: Hepatitis B Surface Ag: NEGATIVE

## 2016-10-25 LAB — HEPATITIS B SURFACE ANTIBODY, QUANTITATIVE: Hepatitis B Surf Ab Quant: <3.1 m[IU]/mL — ABNORMAL LOW (ref >9.9)

## 2016-10-25 LAB — RPR: RPR: NONREACTIVE

## 2016-10-25 NOTE — Assessment & Plan Note (Signed)
Refer to [facility] [place]

## 2016-10-25 NOTE — Assessment & Plan Note (Signed)
Resume OTC oral antihistamine, albuterol rescue.

## 2016-10-25 NOTE — Assessment & Plan Note (Signed)
Continue PRN ibuprofen. Weight loss. Consider referral to ortho. May need updated imaging.

## 2016-10-25 NOTE — Assessment & Plan Note (Signed)
Not well controlled. Desires psychiatry evaluation. Needs counseling as well.

## 2016-10-25 NOTE — Assessment & Plan Note (Deleted)
Not well controlled. Desires psychiatry evaluation. Needs counseling as well.

## 2016-10-29 ENCOUNTER — Encounter: Payer: Self-pay | Admitting: Physician Assistant

## 2017-02-25 ENCOUNTER — Emergency Department (HOSPITAL_COMMUNITY)
Admission: EM | Admit: 2017-02-25 | Discharge: 2017-02-25 | Disposition: A | Discharge status: Home / Self Care | Payer: 59 | Attending: Emergency Medicine | Admitting: Emergency Medicine

## 2017-02-25 DIAGNOSIS — R51 Headache: Secondary | ICD-10-CM | POA: Insufficient documentation

## 2017-02-25 DIAGNOSIS — Z79899 Other long term (current) drug therapy: Secondary | ICD-10-CM | POA: Insufficient documentation

## 2017-02-25 DIAGNOSIS — I10 Essential (primary) hypertension: Secondary | ICD-10-CM | POA: Insufficient documentation

## 2017-02-25 DIAGNOSIS — R519 Headache, unspecified: Secondary | ICD-10-CM

## 2017-02-25 DIAGNOSIS — J45909 Unspecified asthma, uncomplicated: Secondary | ICD-10-CM | POA: Insufficient documentation

## 2017-02-25 LAB — I-STAT CHEM 8, ED
BUN: 5 mg/dL — AB (ref 6–20)
CALCIUM ION: 1.1 mmol/L — AB (ref 1.15–1.40)
CHLORIDE: 106 mmol/L (ref 101–111)
Creatinine, Ser: 0.8 mg/dL (ref 0.44–1.00)
GLUCOSE: 97 mg/dL (ref 65–99)
HCT: 40 % (ref 36.0–46.0)
Hemoglobin: 13.6 g/dL (ref 12.0–15.0)
Potassium: 4.1 mmol/L (ref 3.5–5.1)
Sodium: 140 mmol/L (ref 135–145)
TCO2: 27 mmol/L (ref 22–32)

## 2017-02-25 LAB — I-STAT BETA HCG BLOOD, ED (MC, WL, AP ONLY): I-stat hCG, quantitative: <5 m[IU]/mL (ref <5)

## 2017-02-25 MED ORDER — SODIUM CHLORIDE 0.9 % IV BOLUS (SEPSIS)
1000.0000 mL | Freq: Once | INTRAVENOUS | Status: AC
  Administered 2017-02-25: 1000 mL via INTRAVENOUS

## 2017-02-25 MED ORDER — SUMATRIPTAN SUCCINATE 50 MG PO TABS: 50.0000 mg | ORAL_TABLET | ORAL | 0 refills | Status: DC | PRN

## 2017-02-25 MED ORDER — SUMATRIPTAN SUCCINATE 6 MG/0.5ML ~~LOC~~ SOLN
6.0000 mg | Freq: Once | SUBCUTANEOUS | Status: AC
  Administered 2017-02-25: 6 mg via SUBCUTANEOUS
  Filled 2017-02-25: qty 0.5

## 2017-02-25 MED ORDER — PROCHLORPERAZINE EDISYLATE 5 MG/ML IJ SOLN
10.0000 mg | Freq: Once | INTRAMUSCULAR | Status: AC
  Administered 2017-02-25: 10 mg via INTRAVENOUS
  Filled 2017-02-25: qty 2

## 2017-02-25 MED ORDER — KETOROLAC TROMETHAMINE 30 MG/ML IJ SOLN
15.0000 mg | Freq: Once | INTRAMUSCULAR | Status: AC
  Administered 2017-02-25: 15 mg via INTRAVENOUS
  Filled 2017-02-25: qty 1

## 2017-02-25 NOTE — ED Triage Notes (Signed)
Patient from work complaining of frontal headache that started at 0300 this morning.  Patient states she has history of migraines but is not prescribed medication for them.  She also states this migraine is worse than others.  Complains of photosensitivity and increased pain when shrugging shoulders.  Alert and oriented at this time.
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
## 2017-02-25 NOTE — Discharge Instructions (Signed)
As discussed, your evaluation today has been largely reassuring.  But, it is important that you monitor your condition carefully, and do not hesitate to return to the ED if you develop new, or concerning changes in your condition. ? ?Otherwise, please follow-up with your physician for appropriate ongoing care. ? ?

## 2017-02-25 NOTE — ED Notes (Signed)
Patient ambulated to restroom in [place], unassisted. Gait slow, steady. No distress noted.

## 2017-03-11 ENCOUNTER — Encounter: Payer: Self-pay | Admitting: Physician Assistant

## 2017-03-11 ENCOUNTER — Ambulatory Visit (INDEPENDENT_AMBULATORY_CARE_PROVIDER_SITE_OTHER): Payer: 59 | Admitting: Physician Assistant

## 2017-03-11 VITALS — BP 140/90 | HR 80 | Temp 98.7°F | Resp 18 | Ht 64.0 in | Wt 214.4 lb

## 2017-03-11 DIAGNOSIS — G43009 Migraine without aura, not intractable, without status migrainosus: Secondary | ICD-10-CM

## 2017-03-11 DIAGNOSIS — F332 Major depressive disorder, recurrent severe without psychotic features: Secondary | ICD-10-CM | POA: Diagnosis not present

## 2017-03-11 DIAGNOSIS — J302 Other seasonal allergic rhinitis: Secondary | ICD-10-CM | POA: Diagnosis not present

## 2017-03-11 MED ORDER — FLUTICASONE PROPIONATE 50 MCG/ACT NA SUSP: 2.0000 | Freq: Every day | NASAL | 99 refills | Status: AC

## 2017-03-11 MED ORDER — TOPIRAMATE 50 MG PO TABS: 25.0000 mg | ORAL_TABLET | Freq: Every day | ORAL | 1 refills | Status: DC

## 2017-03-11 NOTE — Patient Instructions (Addendum)
Take the topiramate each evening. Start with 25 mg (1/2 tablet) and increase to 50 mg (1 whole tablet) in 4-7 days. You can increase to 100 mg (2 tablets) if needed.  When you need to take the sumatriptan (Imitrex), take 100 mg (2 tablets). If you have any headache left in 2 hours, take another 100 mg (2 tablets). The maximum dose is 200 mg in 24 hours.  Take the loratadine (Claritin) daily, and add the nasal spray.    IF you received an x-ray today, you will receive an invoice from [facility]. Please contact [facility] at [phone] with questions or concerns regarding your invoice.   IF you received labwork today, you will receive an invoice from [facility]. Please contact [facility] at [phone] with questions or concerns regarding your invoice.   Our billing staff will not be able to assist you with questions regarding bills from these companies.  You will be contacted with the lab results as soon as they are available. The fastest way to get your results is to activate your My Chart account. Instructions are located on the last page of this paperwork. If you have not heard from [place] regarding the results in 2 weeks, please contact this office.

## 2017-03-11 NOTE — Progress Notes (Signed)
Patient ID: EMYA PICADO, female    DOB: August 23, 1970, 46 y.o.   MRN: [id number]  PCP: Harrison Mons, PA-C  Chief Complaint  Patient presents with  . Headache    Pt states she currently has a sinus headache but hasn't had a headache since Friday.  . Depression    Depression scale score 14  . Paperwork    Pt would like FMLA paper filled out in case her headaches are too bad and can't go to work her job can't hold it against her.    Subjective:   Presents for evaluation of headaches.  She was seen in the ED on 10/02 with HA that woke her from sleep. It was similar is quality to her typical headaches, but more severe than usual. She was treated with sumatriptan 6 mg SQ, ketorolac 15 mg IV and compazine 10 mg IV in the ED, prescribed sumatriptan 50 mg PO for presumed migraine and advised to follow-up with a primary care provider.   She notes that the first dose of sumatriptan nearly resolves her headache, and that the second typically eliminates it. She will be headache free for 1-2 days, then it recurs.  Head pain is anterior and occipital and associated with photophobia, but no aura, nausea, weakness or visual disturbances.  Initial diagnosis of migraine was with neurology many years ago. Headaches initially began age 65-21 and she thinks they were about 1/month. She may have taken a daily preventive medication, and doesn't recall any side effects with treatment. Since then, she had only 1 headache/year, and hadn't used any treatment in years, until this summer, when she noticed frequent mild headaches. She controlled these, occurring every few days, with OTC NSAIDS.  Thinks that the headaches are exacerbating her depressed mood, and possible vice versa. Her college-age daughter is causing significant home stress. Family stress following her step-father's death, involving 4 small children living in the home.  Last night she developed allergy-type symptoms that are associated  with change of season.   Review of Systems As above.  Depression screen [facility] 2/9 03/11/2017 10/24/2016  Decreased Interest 3 0  Down, Depressed, Hopeless 1 0  PHQ - 2 Score 4 0  Altered sleeping 3 -  Tired, decreased energy 3 -  Change in appetite 2 -  Feeling bad or failure about yourself  1 -  Trouble concentrating 1 -  Moving slowly or fidgety/restless 0 -  Suicidal thoughts 0 -  PHQ-9 Score 14 -  Difficult doing work/chores Somewhat difficult -     Patient Active Problem List   Diagnosis Date Noted  . BMI 37.0-37.9, adult 10/24/2016  . Right knee pain 10/24/2016  . Extrinsic asthma 10/24/2016  . MDD (major depressive disorder), recurrent severe, without psychosis ([place]) 12/07/2014  . Chronic post-traumatic stress disorder (PTSD) 12/07/2014  . Headache 08/22/2012     Prior to Admission medications   Medication Sig Start Date End Date Taking? Authorizing Provider  albuterol (PROVENTIL HFA;VENTOLIN HFA) 108 (90 Base) MCG/ACT inhaler Inhale 1-2 puffs into the lungs every 6 (six) hours as needed for wheezing or shortness of breath. 10/24/16  Yes Shwanda Soltis, PA-C  ibuprofen (ADVIL,MOTRIN) 800 MG tablet Take 1 tablet (800 mg total) by mouth every 8 (eight) hours as needed. 09/02/16  Yes Shelly Bombard, MD  loratadine (CLARITIN) 10 MG tablet Take 1 tablet (10 mg total) by mouth daily. May purchase over the counter for treatment of allergies. 12/13/14  Yes Niel Hummer, NP  SUMAtriptan (IMITREX) 50 MG tablet Take 1 tablet (50 mg total) by mouth every 2 (two) hours as needed for migraine. May repeat in 2 hours if headache persists or recurs. 02/25/17  Yes Carmin Muskrat, MD     No Known Allergies     Objective:  Physical Exam  Constitutional: She is oriented to person, place, and time. She appears well-developed and well-nourished. She is active and cooperative. No distress.  BP 140/90 (BP Location: Left Arm, Patient Position: Sitting, Cuff Size: Large)   Pulse 80    Temp 98.7 F (37.1 C) (Oral)   Resp 18   Ht 5\' 4"  (1.626 m)   Wt 214 lb 6.4 oz (97.3 kg)   LMP 03/08/2017   SpO2 97%   BMI 36.80 kg/m   HENT:  Head: Normocephalic and atraumatic.  Right Ear: Hearing normal.  Left Ear: Hearing normal.  Eyes: Pupils are equal, round, and reactive to light. Conjunctivae and EOM are normal. No scleral icterus.  Fundoscopic exam:      The right eye shows no hemorrhage and no papilledema. The right eye shows red reflex.       The left eye shows no hemorrhage and no papilledema. The left eye shows red reflex.  Neck: Normal range of motion. Neck supple. No thyromegaly present.  Cardiovascular: Normal rate, regular rhythm and normal heart sounds.   Pulses:      Radial pulses are 2+ on the right side, and 2+ on the left side.  Pulmonary/Chest: Effort normal and breath sounds normal.  Lymphadenopathy:       Head (right side): No tonsillar, no preauricular, no posterior auricular and no occipital adenopathy present.       Head (left side): No tonsillar, no preauricular, no posterior auricular and no occipital adenopathy present.    She has no cervical adenopathy.       Right: No supraclavicular adenopathy present.       Left: No supraclavicular adenopathy present.  Neurological: She is alert and oriented to person, place, and time. She has normal strength. No cranial nerve deficit or sensory deficit.  Skin: Skin is warm, dry and intact. No rash noted. No cyanosis or erythema. Nails show no clubbing.  Psychiatric: She has a normal mood and affect. Her speech is normal and behavior is normal.           Assessment & Plan:   Problem List Items Addressed This Visit    MDD (major depressive disorder), recurrent severe, without psychosis ([place]) (Chronic)    Aggravated by headaches.      Migraine headache - Primary    INCREASE sumatriptan to 100 mg. START topiramate.       Relevant Medications   topiramate (TOPAMAX) 50 MG tablet   Seasonal allergies     Relevant Medications   fluticasone (FLONASE) 50 MCG/ACT nasal spray       Return in about 4 weeks (around 04/08/2017) for re-evaluation of headache.   Fara Chute, PA-C Primary Care at [place]

## 2017-03-15 NOTE — Assessment & Plan Note (Signed)
INCREASE sumatriptan to 100 mg. START topiramate.

## 2017-03-15 NOTE — Assessment & Plan Note (Signed)
Aggravated by headaches.

## 2017-03-24 ENCOUNTER — Telehealth: Payer: Self-pay | Admitting: Physician Assistant

## 2017-03-24 NOTE — Telephone Encounter (Signed)
Paperwork updated and refaxed on 03/24/17

## 2017-03-24 NOTE — Telephone Encounter (Signed)
PATIENT STATES Jane Snyder FILLED OUT SOME FMLA PAPER WORK FOR HER ON 03/11/2017. THERE WERE 2 SECTIONS THAT WERE NOT COMPLETED. PART A - QUESTION 2 AND PART C - QUESTION 3-B. SHE SAID WE SHOULD HAVE A COPY ON FILE TO COMPLETE. PLEASE CALL HER WHEN IT HAS BEEN DONE. BEST PHONE [phone] (CELL) [place]

## 2017-04-01 NOTE — Telephone Encounter (Signed)
Paperwork corrected and refaxed on 04/01/17

## 2017-04-01 NOTE — Telephone Encounter (Signed)
Pt called and said that the part C on the FMLA paperwork question 3A concerning the dates of leave, paper states the dates say Feb 25 2017-August 26 2016.Marland Kitchen And it needs to be Oct 06-2016 to August 26 2017. If she could please fix the error, sign it and initial it and re fax. She says they are denying her for FMLA.

## 2017-04-08 ENCOUNTER — Encounter: Payer: Self-pay | Admitting: Physician Assistant

## 2017-04-08 ENCOUNTER — Other Ambulatory Visit: Payer: Self-pay

## 2017-04-08 ENCOUNTER — Ambulatory Visit (INDEPENDENT_AMBULATORY_CARE_PROVIDER_SITE_OTHER): Payer: 59 | Admitting: Physician Assistant

## 2017-04-08 VITALS — BP 128/88 | HR 81 | Temp 98.0°F | Resp 18 | Ht 64.0 in | Wt 219.8 lb

## 2017-04-08 DIAGNOSIS — G43009 Migraine without aura, not intractable, without status migrainosus: Secondary | ICD-10-CM

## 2017-04-08 DIAGNOSIS — M545 Low back pain, unspecified: Secondary | ICD-10-CM

## 2017-04-08 DIAGNOSIS — G8929 Other chronic pain: Secondary | ICD-10-CM | POA: Diagnosis not present

## 2017-04-08 DIAGNOSIS — M25561 Pain in right knee: Secondary | ICD-10-CM

## 2017-04-08 MED ORDER — TOPIRAMATE 100 MG PO TABS: 100.0000 mg | ORAL_TABLET | Freq: Every day | ORAL | 3 refills | Status: DC

## 2017-04-08 MED ORDER — SUMATRIPTAN SUCCINATE 50 MG PO TABS: 50.0000 mg | ORAL_TABLET | ORAL | 99 refills | Status: DC | PRN

## 2017-04-08 NOTE — Patient Instructions (Addendum)
INCREASE the topiramate from 50 mg to 100 mg. Let me know if it makes you more sleepy in the mornings.   IF you received an x-ray today, you will receive an invoice from [facility]. Please contact [facility] at [phone] with questions or concerns regarding your invoice.   IF you received labwork today, you will receive an invoice from [facility]. Please contact [facility] at [phone] with questions or concerns regarding your invoice.   Our billing staff will not be able to assist you with questions regarding bills from these companies.  You will be contacted with the lab results as soon as they are available. The fastest way to get your results is to activate your My Chart account. Instructions are located on the last page of this paperwork. If you have not heard from [place] regarding the results in 2 weeks, please contact this office.

## 2017-04-08 NOTE — Progress Notes (Signed)
Patient ID: Jane Snyder, female    DOB: 1971-02-05, 46 y.o.   MRN: [id number]  PCP: Harrison Mons, PA-C  Chief Complaint  Patient presents with  . Headache    Pt states headaches have been a lot more tolerable.  . Follow-up  . Knee Pain    x3 weeks, right knee and hip pain  . Hip Pain    Subjective:   Presents for evaluation of headaches and knee and hip pain.  Headaches are improved. Taking topiramate 50 mg QHS. "Always sleepy" but not worse than before. Imitrex works with one dose. Has had only 3 headaches since starting topiramate 4 weeks ago.  RIGHT hip and knee pain are chronic, but more severe x 3 weeks. No inciting event, trauma or injury. History of cortisone injections for torn cartilage in the RIGHT knee. Pain all the time. Worse with prolonged sitting, which she does at work 8 hours/day. Worse with sit-stand. Takes a moment before she can walk normally, but once she gets to moving around, it eases off. Ibuprofen 800 mg helps. Takes it Q4 hours, but not more than TID.   Review of Systems As above.    Patient Active Problem List   Diagnosis Date Noted  . Seasonal allergies 03/11/2017  . BMI 37.0-37.9, adult 10/24/2016  . Right knee pain 10/24/2016  . Extrinsic asthma 10/24/2016  . MDD (major depressive disorder), recurrent severe, without psychosis ([place]) 12/07/2014  . Chronic post-traumatic stress disorder (PTSD) 12/07/2014  . Migraine headache 08/22/2012     Prior to Admission medications   Medication Sig Start Date End Date Taking? Authorizing Provider  albuterol (PROVENTIL HFA;VENTOLIN HFA) 108 (90 Base) MCG/ACT inhaler Inhale 1-2 puffs into the lungs every 6 (six) hours as needed for wheezing or shortness of breath. 10/24/16  Yes Alvin Diffee, PA-C  fluticasone (FLONASE) 50 MCG/ACT nasal spray Place 2 sprays into both nostrils daily. 03/11/17  Yes Undray Allman, PA-C  ibuprofen (ADVIL,MOTRIN) 800 MG tablet Take 1 tablet (800 mg  total) by mouth every 8 (eight) hours as needed. 09/02/16  Yes Shelly Bombard, MD  loratadine (CLARITIN) 10 MG tablet Take 1 tablet (10 mg total) by mouth daily. May purchase over the counter for treatment of allergies. 12/13/14  Yes Niel Hummer, NP  SUMAtriptan (IMITREX) 50 MG tablet Take 1 tablet (50 mg total) by mouth every 2 (two) hours as needed for migraine. May repeat in 2 hours if headache persists or recurs. 02/25/17  Yes Carmin Muskrat, MD  topiramate (TOPAMAX) 50 MG tablet Take 0.5-2 tablets (25-100 mg total) by mouth at bedtime. 03/11/17  Yes August Longest, PA-C     No Known Allergies     Objective:  Physical Exam  Constitutional: She is oriented to person, place, and time. She appears well-developed and well-nourished. She is active and cooperative. No distress.  BP 128/88 (BP Location: Left Arm, Patient Position: Sitting, Cuff Size: Large)   Pulse 81   Temp 98 F (36.7 C) (Oral)   Resp 18   Ht 5\' 4"  (1.626 m)   Wt 219 lb 12.8 oz (99.7 kg)   LMP 03/29/2017   SpO2 100%   BMI 37.73 kg/m   HENT:  Head: Normocephalic and atraumatic.  Right Ear: Hearing normal.  Left Ear: Hearing normal.  Eyes: Conjunctivae are normal. No scleral icterus.  Neck: Normal range of motion. Neck supple. No thyromegaly present.  Cardiovascular: Normal rate, regular rhythm and normal heart sounds.  Pulses:  Radial pulses are 2+ on the right side, and 2+ on the left side.  Pulmonary/Chest: Effort normal and breath sounds normal.  Musculoskeletal:       Right knee: Normal.       Left knee: Normal.       Lumbar back: She exhibits tenderness (RIGHT SI joint) and pain. She exhibits normal range of motion, no bony tenderness, no swelling, no edema, no deformity, no laceration, no spasm and normal pulse.  Lymphadenopathy:       Head (right side): No tonsillar, no preauricular, no posterior auricular and no occipital adenopathy present.       Head (left side): No tonsillar, no preauricular,  no posterior auricular and no occipital adenopathy present.    She has no cervical adenopathy.       Right: No supraclavicular adenopathy present.       Left: No supraclavicular adenopathy present.  Neurological: She is alert and oriented to person, place, and time. No sensory deficit.  Skin: Skin is warm, dry and intact. No rash noted. No cyanosis or erythema. Nails show no clubbing.  Psychiatric: She has a normal mood and affect. Her speech is normal and behavior is normal.           Assessment & Plan:   Problem List Items Addressed This Visit    Migraine headache - Primary    Significant improvement with topiramate. Try increase to 100 mg. If increased somnolence, would try change to extended release product. Continue PRN sumatriptan.      Relevant Medications   SUMAtriptan (IMITREX) 50 MG tablet   topiramate (TOPAMAX) 100 MG tablet   Right knee pain    Acute on chronic, likely due to degenerative changes. Ibuprofen 800 mg helps, but advised to reduce to not more than TID. Refer to orthopedics.      Low back pain    This is her "hip pain." Ice, stretching. Referral to orthopedics.      Relevant Orders   Ambulatory referral to Orthopedic Surgery       Return in about 4 weeks (around 05/06/2017) for re-evaluation of headaches, back pain.   Fara Chute, PA-C Primary Care at [place]

## 2017-04-14 DIAGNOSIS — Z0271 Encounter for disability determination: Secondary | ICD-10-CM

## 2017-04-20 NOTE — Assessment & Plan Note (Signed)
Significant improvement with topiramate. Try increase to 100 mg. If increased somnolence, would try change to extended release product. Continue PRN sumatriptan.

## 2017-04-20 NOTE — Assessment & Plan Note (Signed)
Acute on chronic, likely due to degenerative changes. Ibuprofen 800 mg helps, but advised to reduce to not more than TID. Refer to orthopedics.

## 2017-04-20 NOTE — Assessment & Plan Note (Signed)
This is her "hip pain." Ice, stretching. Referral to orthopedics.

## 2017-05-09 ENCOUNTER — Ambulatory Visit: Payer: 59 | Admitting: Physician Assistant

## 2017-05-15 ENCOUNTER — Encounter (INDEPENDENT_AMBULATORY_CARE_PROVIDER_SITE_OTHER): Payer: 59

## 2017-08-22 ENCOUNTER — Ambulatory Visit (INDEPENDENT_AMBULATORY_CARE_PROVIDER_SITE_OTHER): Payer: Managed Care, Other (non HMO) | Admitting: Physician Assistant

## 2017-08-22 ENCOUNTER — Other Ambulatory Visit: Payer: Self-pay

## 2017-08-22 ENCOUNTER — Encounter: Payer: Self-pay | Admitting: Physician Assistant

## 2017-08-22 VITALS — BP 122/60 | HR 88 | Temp 99.1°F | Resp 16 | Ht 64.0 in | Wt 217.6 lb

## 2017-08-22 DIAGNOSIS — G43109 Migraine with aura, not intractable, without status migrainosus: Secondary | ICD-10-CM | POA: Diagnosis not present

## 2017-08-22 DIAGNOSIS — R030 Elevated blood-pressure reading, without diagnosis of hypertension: Secondary | ICD-10-CM

## 2017-08-22 MED ORDER — PROMETHAZINE HCL 25 MG PO TABS: 25.0000 mg | ORAL_TABLET | Freq: Three times a day (TID) | ORAL | 0 refills | Status: DC | PRN

## 2017-08-22 MED ORDER — RIZATRIPTAN BENZOATE 10 MG PO TBDP: 10.0000 mg | ORAL_TABLET | ORAL | 99 refills | Status: DC | PRN

## 2017-08-22 NOTE — Progress Notes (Signed)
Patient ID: Jane Snyder, female    DOB: 07/28/1970, 47 y.o.   MRN: [id number]  PCP: Harrison Mons, PA-C  Chief Complaint  Patient presents with  . FMLA forms    Subjective:   Presents for evaluation of migraine headache, and needs FMLA forms updated.  Stable. Works around a lot of lights. Needs a note that she needs a computer screen protector to reduce glare from the lights, which triggers her headache.  Continues to get HA pretty often. HA occurs behind her eyes. Inconsistently taking topiramate, but takes it more often than not. Imitrex helps, but does not eliminate the headache when she takes it, even with the second dose. Gets 1-2 headaches every other week. Headaches are worse during the time of her expected menses, accompanying her prementrual symptoms, even though she isn't actually having periods. Has a visit with GYN 09/02/2017.   Review of Systems As above    Patient Active Problem List   Diagnosis Date Noted  . Low back pain 04/08/2017  . Seasonal allergies 03/11/2017  . BMI 37.0-37.9, adult 10/24/2016  . Right knee pain 10/24/2016  . Extrinsic asthma 10/24/2016  . MDD (major depressive disorder), recurrent severe, without psychosis ([place]) 12/07/2014  . Chronic post-traumatic stress disorder (PTSD) 12/07/2014  . Migraine headache 08/22/2012     Prior to Admission medications   Medication Sig Start Date End Date Taking? Authorizing Provider  albuterol (PROVENTIL HFA;VENTOLIN HFA) 108 (90 Base) MCG/ACT inhaler Inhale 1-2 puffs into the lungs every 6 (six) hours as needed for wheezing or shortness of breath. 10/24/16  Yes Lyndi Holbein, PA-C  fluticasone (FLONASE) 50 MCG/ACT nasal spray Place 2 sprays into both nostrils daily. 03/11/17  Yes Sadik Piascik, PA-C  ibuprofen (ADVIL,MOTRIN) 800 MG tablet Take 1 tablet (800 mg total) by mouth every 8 (eight) hours as needed. 09/02/16  Yes Shelly Bombard, MD  loratadine (CLARITIN) 10 MG tablet Take  1 tablet (10 mg total) by mouth daily. May purchase over the counter for treatment of allergies. 12/13/14  Yes Niel Hummer, NP  SUMAtriptan (IMITREX) 50 MG tablet Take 1 tablet (50 mg total) every 2 (two) hours as needed by mouth for migraine. May repeat in 2 hours if headache persists or recurs. 04/08/17  Yes Issabella Rix, PA-C  topiramate (TOPAMAX) 100 MG tablet Take 1 tablet (100 mg total) at bedtime by mouth. 04/08/17  Yes Gadiel John, PA-C     No Known Allergies     Objective:  Physical Exam  Constitutional: She is oriented to person, place, and time. She appears well-developed and well-nourished. She is active and cooperative. No distress.  BP 122/60   Pulse 88   Temp 99.1 F (37.3 C)   Resp 16   Ht 5\' 4"  (1.626 m)   Wt 217 lb 9.6 oz (98.7 kg)   SpO2 99%   BMI 37.35 kg/m   HENT:  Head: Normocephalic and atraumatic.  Right Ear: Hearing normal.  Left Ear: Hearing normal.  Eyes: Conjunctivae are normal. No scleral icterus.  Neck: Normal range of motion. Neck supple. No thyromegaly present.  Cardiovascular: Normal rate, regular rhythm and normal heart sounds.  Pulses:      Radial pulses are 2+ on the right side, and 2+ on the left side.  Pulmonary/Chest: Effort normal and breath sounds normal.  Lymphadenopathy:       Head (right side): No tonsillar, no preauricular, no posterior auricular and no occipital adenopathy present.  Head (left side): No tonsillar, no preauricular, no posterior auricular and no occipital adenopathy present.    She has no cervical adenopathy.       Right: No supraclavicular adenopathy present.       Left: No supraclavicular adenopathy present.  Neurological: She is alert and oriented to person, place, and time. No sensory deficit.  Skin: Skin is warm, dry and intact. No rash noted. No cyanosis or erythema. Nails show no clubbing.  Psychiatric: She has a normal mood and affect. Her speech is normal and behavior is normal.        Assessment & Plan:   Problem List Items Addressed This Visit    Migraine headache - Primary    Stable, but still having 1-2 HA every other week. Possibly related to inconsistent topiramate use, and advised to be more consistent. Computer screen protector to reduce glare-letter written, as requested. FMLA forms updated. Change from sumatriptan to rizatriptan, per patient request.      Relevant Medications   rizatriptan (MAXALT-MLT) 10 MG disintegrating tablet   promethazine (PHENERGAN) 25 MG tablet    Other Visit Diagnoses    Elevated BP without diagnosis of hypertension       Normal on recheck.   Relevant Orders   Care order/instruction: (Completed)       Return in about 6 months (around 02/22/2018) for re-evaluation of migraine, etc.   Fara Chute, PA-C [facility] [place]

## 2017-08-22 NOTE — Patient Instructions (Signed)
     IF you received an x-ray today, you will receive an invoice from [facility]. Please contact [facility] at [phone] with questions or concerns regarding your invoice.   IF you received labwork today, you will receive an invoice from [facility]. Please contact [facility] at [phone] with questions or concerns regarding your invoice.   Our billing staff will not be able to assist you with questions regarding bills from these companies.  You will be contacted with the lab results as soon as they are available. The fastest way to get your results is to activate your My Chart account. Instructions are located on the last page of this paperwork. If you have not heard from us regarding the results in 2 weeks, please contact this office.     

## 2017-08-22 NOTE — Assessment & Plan Note (Addendum)
Stable, but still having 1-2 HA every other week. Possibly related to inconsistent topiramate use, and advised to be more consistent. Computer screen protector to reduce glare-letter written, as requested. FMLA forms updated. Change from sumatriptan to rizatriptan, per patient request.

## 2017-08-28 ENCOUNTER — Encounter: Payer: Self-pay | Admitting: Physician Assistant

## 2017-08-29 ENCOUNTER — Ambulatory Visit: Payer: 59 | Admitting: Physician Assistant

## 2017-09-02 ENCOUNTER — Other Ambulatory Visit: Payer: Self-pay

## 2017-09-02 ENCOUNTER — Ambulatory Visit (INDEPENDENT_AMBULATORY_CARE_PROVIDER_SITE_OTHER): Payer: Managed Care, Other (non HMO) | Admitting: Obstetrics

## 2017-09-02 ENCOUNTER — Encounter: Payer: Self-pay | Admitting: Obstetrics

## 2017-09-02 VITALS — BP 164/95 | HR 66 | Ht 64.0 in | Wt 218.3 lb

## 2017-09-02 DIAGNOSIS — E669 Obesity, unspecified: Secondary | ICD-10-CM

## 2017-09-02 DIAGNOSIS — Z113 Encounter for screening for infections with a predominantly sexual mode of transmission: Secondary | ICD-10-CM

## 2017-09-02 DIAGNOSIS — D219 Benign neoplasm of connective and other soft tissue, unspecified: Secondary | ICD-10-CM

## 2017-09-02 DIAGNOSIS — Z1151 Encounter for screening for human papillomavirus (HPV): Secondary | ICD-10-CM

## 2017-09-02 DIAGNOSIS — Z01419 Encounter for gynecological examination (general) (routine) without abnormal findings: Secondary | ICD-10-CM

## 2017-09-02 DIAGNOSIS — Z124 Encounter for screening for malignant neoplasm of cervix: Secondary | ICD-10-CM | POA: Diagnosis not present

## 2017-09-02 DIAGNOSIS — N939 Abnormal uterine and vaginal bleeding, unspecified: Secondary | ICD-10-CM

## 2017-09-02 DIAGNOSIS — N945 Secondary dysmenorrhea: Secondary | ICD-10-CM

## 2017-09-02 DIAGNOSIS — N946 Dysmenorrhea, unspecified: Secondary | ICD-10-CM

## 2017-09-02 DIAGNOSIS — N760 Acute vaginitis: Secondary | ICD-10-CM

## 2017-09-02 DIAGNOSIS — B9689 Other specified bacterial agents as the cause of diseases classified elsewhere: Secondary | ICD-10-CM

## 2017-09-02 MED ORDER — IBUPROFEN 800 MG PO TABS: 800.0000 mg | ORAL_TABLET | Freq: Three times a day (TID) | ORAL | 5 refills | Status: DC | PRN

## 2017-09-02 MED ORDER — METRONIDAZOLE 500 MG PO TABS: 500.0000 mg | ORAL_TABLET | Freq: Two times a day (BID) | ORAL | 2 refills | Status: DC

## 2017-09-02 NOTE — Progress Notes (Signed)
Presents for AEX/PAP 
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
## 2017-09-03 LAB — CERVICOVAGINAL ANCILLARY ONLY
Bacterial vaginitis: POSITIVE — AB
CANDIDA VAGINITIS: NEGATIVE
Chlamydia: NEGATIVE
Neisseria Gonorrhea: NEGATIVE
Trichomonas: NEGATIVE

## 2017-09-04 ENCOUNTER — Other Ambulatory Visit: Payer: Self-pay | Admitting: Obstetrics

## 2017-09-04 LAB — CYTOLOGY - PAP
Diagnosis: NEGATIVE
HPV (WINDOPATH): NOT DETECTED

## 2017-09-24 ENCOUNTER — Ambulatory Visit (INDEPENDENT_AMBULATORY_CARE_PROVIDER_SITE_OTHER): Payer: Managed Care, Other (non HMO) | Admitting: Obstetrics and Gynecology

## 2017-09-24 ENCOUNTER — Encounter: Payer: Self-pay | Admitting: Obstetrics and Gynecology

## 2017-09-24 ENCOUNTER — Ambulatory Visit: Payer: Self-pay | Admitting: *Deleted

## 2017-09-24 DIAGNOSIS — D219 Benign neoplasm of connective and other soft tissue, unspecified: Secondary | ICD-10-CM | POA: Diagnosis not present

## 2017-09-24 DIAGNOSIS — N939 Abnormal uterine and vaginal bleeding, unspecified: Secondary | ICD-10-CM | POA: Diagnosis not present

## 2017-09-24 NOTE — Progress Notes (Signed)
Presents for Surgical Consult. 

## 2017-09-24 NOTE — Patient Instructions (Signed)
Vaginal Hysterectomy A vaginal hysterectomy is a procedure to remove all or part of the uterus through a small incision in the vagina. In this procedure, your health care provider may remove your entire uterus, including the lower end (cervix). You may need a vaginal hysterectomy to treat:  Uterine fibroids.  A condition that causes the lining of the uterus to grow in other areas (endometriosis).  Problems with pelvic support.  Cancer of the cervix, ovaries, uterus, or tissue that lines the uterus (endometrium).  Excessive (dysfunctional) uterine bleeding.  When removing your uterus, your health care provider may also remove the organs that produce eggs (ovaries) and the tubes that carry eggs to your uterus (fallopian tubes). After a vaginal hysterectomy, you will no longer be able to have a baby. You will also no longer get your menstrual period. Tell a health care provider about:  Any allergies you have.  All medicines you are taking, including vitamins, herbs, eye drops, creams, and over-the-counter medicines.  Any problems you or family members have had with anesthetic medicines.  Any blood disorders you have.  Any surgeries you have had.  Any medical conditions you have.  Whether you are pregnant or may be pregnant. What are the risks? Generally, this is a safe procedure. However, problems may occur, including:  Bleeding.  Infection.  A blood clot that forms in your leg and travels to your lungs (pulmonary embolism).  Damage to surrounding organs.  Pain during sex.  What happens before the procedure?  Ask your health care provider what organs will be removed during surgery.  Ask your health care provider about: ? Changing or stopping your regular medicines. This is especially important if you are taking diabetes medicines or blood thinners. ? Taking medicines such as aspirin and ibuprofen. These medicines can thin your blood. Do not take these medicines before  your procedure if your health care provider instructs you not to.  Follow instructions from your health care provider about eating or drinking restrictions.  Do not use any tobacco products, such as cigarettes, chewing tobacco, and e-cigarettes. If you need help quitting, ask your health care provider.  Plan to have someone take you home after discharge from the hospital. What happens during the procedure?  To reduce your risk of infection: ? Your health care team will wash or sanitize their hands. ? Your skin will be washed with soap.  An IV tube will be inserted into one of your veins.  You may be given antibiotic medicine to help prevent infection.  You will be given one or more of the following: ? A medicine to help you relax (sedative). ? A medicine to numb the area (local anesthetic). ? A medicine to make you fall asleep (general anesthetic). ? A medicine that is injected into an area of your body to numb everything beyond the injection site (regional anesthetic).  Your surgeon will make an incision in your vagina.  Your surgeon will locate and remove all or part of your uterus.  Your ovaries and fallopian tubes may be removed at the same time.  The incision will be closed with stitches (sutures) that dissolve over time. The procedure may vary among health care providers and hospitals. What happens after the procedure?  Your blood pressure, heart rate, breathing rate, and blood oxygen level will be monitored often until the medicines you were given have worn off.  You will be encouraged to get up and walk around after a few hours to help prevent   complications.  You may have IV tubes in place for a few days.  You will be given pain medicine as needed.  Do not drive for 24 hours if you were given a sedative. This information is not intended to replace advice given to you by your health care provider. Make sure you discuss any questions you have with your health care  provider. Document Released: 09/04/2015 Document Revised: 10/19/2015 Document Reviewed: 05/28/2015 Elsevier Interactive Patient Education  2018 Elsevier Inc.  

## 2017-09-24 NOTE — Telephone Encounter (Signed)
  Patient is at her GYN appointment   ( pre-op)and her BP readings are elevated. Patient has no other symptoms and has never been diagnosed with elevated BP.  Appointment scheduled for tomorrow for evaluation. Patient given precautions for the evening. Reason for Disposition . Systolic BP  >= 284 OR Diastolic >= 132  Answer Assessment - Initial Assessment Questions 1. BLOOD PRESSURE: "What is the blood pressure?" "Did you take at least two measurements 5 minutes apart?"     219/106   180/104 2. ONSET: "When did you take your blood pressure?"     4:15 3. HOW: "How did you obtain the blood pressure?" (e.g., visiting nurse, automatic home BP monitor)     manual 4. HISTORY: "Do you have a history of high blood pressure?"     No medications 5. MEDICATIONS: "Are you taking any medications for blood pressure?" "Have you missed any doses recently?"     no 6. OTHER SYMPTOMS: "Do you have any symptoms?" (e.g., headache, chest pain, blurred vision, difficulty breathing, weakness)     no 7. PREGNANCY: "Is there any chance you are pregnant?" "When was your last menstrual period?"     No- LMP 09/10/17  Protocols used: HIGH BLOOD PRESSURE-A-AH

## 2017-09-25 ENCOUNTER — Encounter: Payer: Self-pay | Admitting: Urgent Care

## 2017-09-25 ENCOUNTER — Other Ambulatory Visit: Payer: Self-pay

## 2017-09-25 ENCOUNTER — Ambulatory Visit (INDEPENDENT_AMBULATORY_CARE_PROVIDER_SITE_OTHER): Payer: Managed Care, Other (non HMO) | Admitting: Urgent Care

## 2017-09-25 VITALS — BP 142/90 | HR 78 | Temp 98.6°F | Ht 64.0 in | Wt 219.0 lb

## 2017-09-25 DIAGNOSIS — G43009 Migraine without aura, not intractable, without status migrainosus: Secondary | ICD-10-CM

## 2017-09-25 DIAGNOSIS — I10 Essential (primary) hypertension: Secondary | ICD-10-CM

## 2017-09-25 DIAGNOSIS — R030 Elevated blood-pressure reading, without diagnosis of hypertension: Secondary | ICD-10-CM

## 2017-09-25 MED ORDER — AMLODIPINE BESYLATE 5 MG PO TABS: 5.0000 mg | ORAL_TABLET | Freq: Every day | ORAL | 0 refills | Status: DC
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
## 2017-09-25 NOTE — Patient Instructions (Addendum)
DO NOT EAT SALT. Restaurant foods carry a lot of salt. Mrs. Jane Snyder is a salt free seasoning brand that you can use to cook your own foods. Pay attention to your nutritional labels for the salt/sodium contents. Exercise can help with blood pressure as well including bicycling, swimming, elliptical, yoga. Check your blood pressure at home, every other day, around the same time each day, in a sitting position with arm at about heart level after resting for 5-10 minutes.    Hypertension Hypertension, commonly called high blood pressure, is when the force of blood pumping through the arteries is too strong. The arteries are the blood vessels that carry blood from the heart throughout the body. Hypertension forces the heart to work harder to pump blood and may cause arteries to become narrow or stiff. Having untreated or uncontrolled hypertension can cause heart attacks, strokes, kidney disease, and other problems. A blood pressure reading consists of a higher number over a lower number. Ideally, your blood pressure should be below 120/80. The first ("top") number is called the systolic pressure. It is a measure of the pressure in your arteries as your heart beats. The second ("bottom") number is called the diastolic pressure. It is a measure of the pressure in your arteries as the heart relaxes. What are the causes? The cause of this condition is not known. What increases the risk? Some risk factors for high blood pressure are under your control. Others are not. Factors you can change  Smoking.  Having type 2 diabetes mellitus, high cholesterol, or both.  Not getting enough exercise or physical activity.  Being overweight.  Having too much fat, sugar, calories, or salt (sodium) in your diet.  Drinking too much alcohol. Factors that are difficult or impossible to change  Having chronic kidney disease.  Having a family history of high blood pressure.  Age. Risk increases with age.  Race. You  may be at higher risk if you are African-American.  Gender. Men are at higher risk than women before age 50. After age 64, women are at higher risk than men.  Having obstructive sleep apnea.  Stress. What are the signs or symptoms? Extremely high blood pressure (hypertensive crisis) may cause:  Headache.  Anxiety.  Shortness of breath.  Nosebleed.  Nausea and vomiting.  Severe chest pain.  Jerky movements you cannot control (seizures).  How is this diagnosed? This condition is diagnosed by measuring your blood pressure while you are seated, with your arm resting on a surface. The cuff of the blood pressure monitor will be placed directly against the skin of your upper arm at the level of your heart. It should be measured at least twice using the same arm. Certain conditions can cause a difference in blood pressure between your right and left arms. Certain factors can cause blood pressure readings to be lower or higher than normal (elevated) for a short period of time:  When your blood pressure is higher when you are in a health care provider's office than when you are at home, this is called white coat hypertension. Most people with this condition do not need medicines.  When your blood pressure is higher at home than when you are in a health care provider's office, this is called masked hypertension. Most people with this condition may need medicines to control blood pressure.  If you have a high blood pressure reading during one visit or you have normal blood pressure with other risk factors:  You may be asked  to return on a different day to have your blood pressure checked again.  You may be asked to monitor your blood pressure at home for 1 week or longer.  If you are diagnosed with hypertension, you may have other blood or imaging tests to help your health care provider understand your overall risk for other conditions. How is this treated? This condition is treated by  making healthy lifestyle changes, such as eating healthy foods, exercising more, and reducing your alcohol intake. Your health care provider may prescribe medicine if lifestyle changes are not enough to get your blood pressure under control, and if:  Your systolic blood pressure is above 130.  Your diastolic blood pressure is above 80.  Your personal target blood pressure may vary depending on your medical conditions, your age, and other factors. Follow these instructions at home: Eating and drinking  Eat a diet that is high in fiber and potassium, and low in sodium, added sugar, and fat. An example eating plan is called the DASH (Dietary Approaches to Stop Hypertension) diet. To eat this way: ? Eat plenty of fresh fruits and vegetables. Try to fill half of your plate at each meal with fruits and vegetables. ? Eat whole grains, such as whole wheat pasta, brown rice, or whole grain bread. Fill about one quarter of your plate with whole grains. ? Eat or drink low-fat dairy products, such as skim milk or low-fat yogurt. ? Avoid fatty cuts of meat, processed or cured meats, and poultry with skin. Fill about one quarter of your plate with lean proteins, such as fish, chicken without skin, beans, eggs, and tofu. ? Avoid premade and processed foods. These tend to be higher in sodium, added sugar, and fat.  Reduce your daily sodium intake. Most people with hypertension should eat less than 1,500 mg of sodium a day.  Limit alcohol intake to no more than 1 drink a day for nonpregnant women and 2 drinks a day for men. One drink equals 12 oz of beer, 5 oz of wine, or 1 oz of hard liquor. Lifestyle  Work with your health care provider to maintain a healthy body weight or to lose weight. Ask what an ideal weight is for you.  Get at least 30 minutes of exercise that causes your heart to beat faster (aerobic exercise) most days of the week. Activities may include walking, swimming, or biking.  Include  exercise to strengthen your muscles (resistance exercise), such as pilates or lifting weights, as part of your weekly exercise routine. Try to do these types of exercises for 30 minutes at least 3 days a week.  Do not use any products that contain nicotine or tobacco, such as cigarettes and e-cigarettes. If you need help quitting, ask your health care provider.  Monitor your blood pressure at home as told by your health care provider.  Keep all follow-up visits as told by your health care provider. This is important. Medicines  Take over-the-counter and prescription medicines only as told by your health care provider. Follow directions carefully. Blood pressure medicines must be taken as prescribed.  Do not skip doses of blood pressure medicine. Doing this puts you at risk for problems and can make the medicine less effective.  Ask your health care provider about side effects or reactions to medicines that you should watch for. Contact a health care provider if:  You think you are having a reaction to a medicine you are taking.  You have headaches that keep coming  back (recurring).  You feel dizzy.  You have swelling in your ankles.  You have trouble with your vision. Get help right away if:  You develop a severe headache or confusion.  You have unusual weakness or numbness.  You feel faint.  You have severe pain in your chest or abdomen.  You vomit repeatedly.  You have trouble breathing. Summary  Hypertension is when the force of blood pumping through your arteries is too strong. If this condition is not controlled, it may put you at risk for serious complications.  Your personal target blood pressure may vary depending on your medical conditions, your age, and other factors. For most people, a normal blood pressure is less than 120/80.  Hypertension is treated with lifestyle changes, medicines, or a combination of both. Lifestyle changes include weight loss, eating a  healthy, low-sodium diet, exercising more, and limiting alcohol. This information is not intended to replace advice given to you by your health care provider. Make sure you discuss any questions you have with your health care provider. Document Released: 05/13/2005 Document Revised: 04/10/2016 Document Reviewed: 04/10/2016 Elsevier Interactive Patient Education  [employer].

## 2017-09-25 NOTE — Progress Notes (Signed)
Patient ID: CLAUDEEN LEASON, female   DOB: Feb 06, 1971, 47 y.o.   MRN: [id number] Ms Donoso presents in referral from Dr. Johnney Ou for possible hysterectomy. Pt has had 2 endometrial ablations but continues to have some degree of bleeding on a daily basis. Last U/S was 3/17 and revealed submucosal, subserosal and intermural fibroids.  Last pap 4/19 normal TSVD x 3, largest 6 # 6 oz BTL, breast reduction Sexual active H/O migraine HA  PE AF BP 164/94 Lungs clear Heart RRR Abd soft + BS GU nl EGBUS uterus < 10 weeks, mobile no masses  A/P AUB       H/O uterine fibroids       Hypertension  Pt desires definite therapy. TVH with BS reviewed with pt. R/B/post op care reviewed Will check GYN U/S. Pt to see PCP in regards to her BP. Will schedule once U/S completed and BP has been address.

## 2017-09-26 LAB — COMPREHENSIVE METABOLIC PANEL
A/G RATIO: 1.1 — AB (ref 1.2–2.2)
ALT: 9 IU/L (ref 0–32)
AST: 15 IU/L (ref 0–40)
Albumin: 3.7 g/dL (ref 3.5–5.5)
Alkaline Phosphatase: 71 IU/L (ref 39–117)
BUN/Creatinine Ratio: 10 (ref 9–23)
BUN: 9 mg/dL (ref 6–24)
CHLORIDE: 102 mmol/L (ref 96–106)
CO2: 24 mmol/L (ref 20–29)
Calcium: 8.9 mg/dL (ref 8.7–10.2)
Creatinine, Ser: 0.92 mg/dL (ref 0.57–1.00)
GFR calc non Af Amer: 76 mL/min/{1.73_m2} (ref >59)
GFR, EST AFRICAN AMERICAN: 88 mL/min/{1.73_m2} (ref >59)
GLOBULIN, TOTAL: 3.4 g/dL (ref 1.5–4.5)
Glucose: 107 mg/dL — ABNORMAL HIGH (ref 65–99)
POTASSIUM: 4 mmol/L (ref 3.5–5.2)
SODIUM: 137 mmol/L (ref 134–144)
TOTAL PROTEIN: 7.1 g/dL (ref 6.0–8.5)

## 2017-09-26 LAB — TSH: TSH: 1.36 u[IU]/mL (ref 0.450–4.500)

## 2017-09-29 ENCOUNTER — Encounter: Payer: Self-pay | Admitting: Urgent Care

## 2017-10-01 ENCOUNTER — Ambulatory Visit
Admission: RE | Admit: 2017-10-01 | Discharge: 2017-10-01 | Disposition: A | Discharge status: Home / Self Care | Payer: Managed Care, Other (non HMO) | Source: Ambulatory Visit | Attending: Obstetrics and Gynecology | Admitting: Obstetrics and Gynecology

## 2017-10-01 DIAGNOSIS — N939 Abnormal uterine and vaginal bleeding, unspecified: Secondary | ICD-10-CM

## 2017-10-01 DIAGNOSIS — D219 Benign neoplasm of connective and other soft tissue, unspecified: Secondary | ICD-10-CM

## 2017-10-25 ENCOUNTER — Telehealth: Payer: Self-pay | Admitting: Physician Assistant

## 2017-10-25 DIAGNOSIS — J45909 Unspecified asthma, uncomplicated: Secondary | ICD-10-CM

## 2017-10-27 NOTE — Telephone Encounter (Signed)
Ventolin refill Last Refill:10/24/16 # 1 inhaler 2 refills Last OV: 10/24/16 PCP: Harrison Mons PA Pharmacy:[facility]

## 2017-10-28 ENCOUNTER — Encounter: Payer: Self-pay | Admitting: Physician Assistant

## 2017-10-28 ENCOUNTER — Ambulatory Visit (INDEPENDENT_AMBULATORY_CARE_PROVIDER_SITE_OTHER): Payer: Managed Care, Other (non HMO) | Admitting: Physician Assistant

## 2017-10-28 ENCOUNTER — Other Ambulatory Visit: Payer: Self-pay

## 2017-10-28 VITALS — BP 124/90 | HR 74 | Temp 98.6°F | Resp 16 | Ht 64.37 in | Wt 217.0 lb

## 2017-10-28 DIAGNOSIS — I10 Essential (primary) hypertension: Secondary | ICD-10-CM

## 2017-10-28 MED ORDER — AMLODIPINE BESYLATE 10 MG PO TABS: 10.0000 mg | ORAL_TABLET | Freq: Every day | ORAL | 3 refills | Status: AC

## 2017-10-28 NOTE — Progress Notes (Signed)
Patient ID: Jane Snyder, female    DOB: [birth year], 47 y.o.   MRN: [id number]  PCP: Harrison Mons, PA-C  Chief Complaint  Patient presents with  . Hypertension    Subjective:   Presents for evaluation of HTN.  Last month, we started amlodipine, in hopes of improving the diastolic elevation. She is waiting on clearance for planned hysterectomy.  Notes nocturia, 2-3 times. No increased urinary frequency during the day.  Has intermittent tingling in the hands before onset of headache. Headache associated with dizziness.  Fatigue after work.  Review of Systems  Constitutional: Positive for fatigue (after work). Negative for activity change, appetite change, chills, diaphoresis, fever and unexpected weight change.  HENT: Negative for sore throat.   Eyes: Negative for visual disturbance.  Respiratory: Negative for cough, chest tightness, shortness of breath and wheezing.   Cardiovascular: Negative for chest pain and palpitations.  Gastrointestinal: Negative for abdominal pain, diarrhea, nausea and vomiting.  Genitourinary: Positive for frequency (nocturia). Negative for dysuria, hematuria and urgency.  Musculoskeletal: Negative for arthralgias and myalgias.  Skin: Negative for rash.  Neurological: Positive for headaches (less frequent since starting amlodipine). Negative for dizziness and weakness.  Psychiatric/Behavioral: Negative for decreased concentration. The patient is not nervous/anxious.        Patient Active Problem List   Diagnosis Date Noted  . Abnormal uterine bleeding (AUB) 09/24/2017  . Fibroids 09/24/2017  . Low back pain 04/08/2017  . Seasonal allergies 03/11/2017  . BMI 37.0-37.9, adult 10/24/2016  . Right knee pain 10/24/2016  . Extrinsic asthma 10/24/2016  . MDD (major depressive disorder), recurrent severe, without psychosis ([place]) 12/07/2014  . Chronic post-traumatic stress disorder (PTSD) 12/07/2014  . Migraine headache 08/22/2012      Prior to Admission medications   Medication Sig Start Date End Date Taking? Authorizing Provider  albuterol (PROVENTIL HFA;VENTOLIN HFA) 108 (90 Base) MCG/ACT inhaler Inhale 1-2 puffs into the lungs every 6 (six) hours as needed for wheezing or shortness of breath. 10/24/16  Yes Ruthellen Tippy, PA-C  amLODipine (NORVASC) 5 MG tablet Take 1 tablet (5 mg total) by mouth daily. 09/25/17  Yes Jaynee Eagles, PA-C  fluticasone (FLONASE) 50 MCG/ACT nasal spray Place 2 sprays into both nostrils daily. 03/11/17  Yes Hermes Wafer, PA-C  ibuprofen (ADVIL,MOTRIN) 800 MG tablet Take 1 tablet (800 mg total) by mouth every 8 (eight) hours as needed. 09/02/17  Yes Shelly Bombard, MD  loratadine (CLARITIN) 10 MG tablet Take 1 tablet (10 mg total) by mouth daily. May purchase over the counter for treatment of allergies. 12/13/14  Yes Niel Hummer, NP  promethazine (PHENERGAN) 25 MG tablet Take 1 tablet (25 mg total) by mouth every 8 (eight) hours as needed for nausea or vomiting. 08/22/17  Yes Larue Drawdy, PA-C  rizatriptan (MAXALT-MLT) 10 MG disintegrating tablet Take 1 tablet (10 mg total) by mouth as needed for migraine. May repeat in 2 hours if needed 08/22/17  Yes Melik Blancett, PA-C     No Known Allergies     Objective:  Physical Exam  Constitutional: She is oriented to person, place, and time. She appears well-developed and well-nourished. She is active and cooperative. No distress.  BP 124/90   Pulse 74   Temp 98.6 F (37 C)   Resp 16   Ht 5' 4.37" (1.635 m)   Wt 217 lb (98.4 kg)   SpO2 98%   BMI 36.82 kg/m   HENT:  Head: Normocephalic and atraumatic.  Right Ear:  Hearing normal.  Left Ear: Hearing normal.  Eyes: Conjunctivae are normal. No scleral icterus.  Neck: Normal range of motion. Neck supple. No thyromegaly present.  Cardiovascular: Normal rate, regular rhythm and normal heart sounds.  Pulses:      Radial pulses are 2+ on the right side, and 2+ on the left side.   Pulmonary/Chest: Effort normal and breath sounds normal.  Lymphadenopathy:       Head (right side): No tonsillar, no preauricular, no posterior auricular and no occipital adenopathy present.       Head (left side): No tonsillar, no preauricular, no posterior auricular and no occipital adenopathy present.    She has no cervical adenopathy.       Right: No supraclavicular adenopathy present.       Left: No supraclavicular adenopathy present.  Neurological: She is alert and oriented to person, place, and time. No sensory deficit.  Skin: Skin is warm, dry and intact. No rash noted. No cyanosis or erythema. Nails show no clubbing.  Psychiatric: She has a normal mood and affect. Her speech is normal and behavior is normal.   Wt Readings from Last 3 Encounters:  10/28/17 217 lb (98.4 kg)  09/25/17 219 lb (99.3 kg)  09/02/17 218 lb 4.8 oz (99 kg)      Assessment & Plan:   Problem List Items Addressed This Visit    Essential hypertension - Primary    Increase amlodipine to 10 mg daily. OK to proceed with hysterectomy.      Relevant Medications   amLODipine (NORVASC) 10 MG tablet       Return in about 2 months (around 12/28/2017) for re-evaluation of blood pressure.   Fara Chute, PA-C [facility]

## 2017-10-28 NOTE — Patient Instructions (Addendum)
Go ahead and call [facility] to schedule your next visit with me there. [phone].   IF you received an x-ray today, you will receive an invoice from [facility]. Please contact [facility] at [phone] with questions or concerns regarding your invoice.   IF you received labwork today, you will receive an invoice from [facility]. Please contact [facility] at [phone] with questions or concerns regarding your invoice.   Our billing staff will not be able to assist you with questions regarding bills from these companies.  You will be contacted with the lab results as soon as they are available. The fastest way to get your results is to activate your My Chart account. Instructions are located on the last page of this paperwork. If you have not heard from us regarding the results in 2 weeks, please contact this office.     

## 2017-10-28 NOTE — Progress Notes (Signed)
Subjective:    Patient ID: Jane Snyder, female    DOB: [birth year], 47 y.o.   MRN: [id number]  Jane Snyder is a 47 year old female presenting for evaluation of blood pressure. She presented to the clinic last month at the request of her OBGYN before proceeding with a hysterectomy. She was placed on amlodipine 5mg /daily and told to follow up in one month. She monitors her blood pressure qam and her systolic values have been between ~950-932 and her diastolic has been in the 67'T reaching to 100. She admits to a family history of hypertension and an increase in life stressors. Her blood pressure at today's visit is 126/90. She is hoping to be released today by [place] to proceed with her hysterectomy. Of note, she also describes feet/finger swelling when she is hot and associated numbness in her fingers beforehand and before she gets a headache.     Hypertension  Associated symptoms include headaches. Pertinent negatives include no chest pain, palpitations or shortness of breath.      Review of Systems  Constitutional: Positive for fatigue (after work). Negative for chills, fever and unexpected weight change.  HENT: Positive for congestion (allergies). Negative for rhinorrhea and sore throat.   Eyes: Negative for pain and visual disturbance.  Respiratory: Negative for chest tightness and shortness of breath.   Cardiovascular: Negative for chest pain and palpitations.  Gastrointestinal: Positive for abdominal pain (fibroids - hysterectomy planned). Negative for blood in stool, constipation, diarrhea, nausea and vomiting.  Genitourinary: Positive for frequency (only at night x 1 year). Negative for difficulty urinating, dysuria and hematuria.  Skin: Negative for color change.  Neurological: Positive for dizziness (when having HA), numbness and headaches.   Patient Active Problem List   Diagnosis Date Noted  . Essential hypertension 10/28/2017  . Abnormal uterine bleeding (AUB)  09/24/2017  . Fibroids 09/24/2017  . Low back pain 04/08/2017  . Seasonal allergies 03/11/2017  . BMI 37.0-37.9, adult 10/24/2016  . Right knee pain 10/24/2016  . Extrinsic asthma 10/24/2016  . MDD (major depressive disorder), recurrent severe, without psychosis ([place]) 12/07/2014  . Chronic post-traumatic stress disorder (PTSD) 12/07/2014  . Migraine headache 08/22/2012   Prior to Admission medications   Medication Sig Start Date End Date Taking? Authorizing Provider  albuterol (PROVENTIL HFA;VENTOLIN HFA) 108 (90 Base) MCG/ACT inhaler Inhale 1-2 puffs into the lungs every 6 (six) hours as needed for wheezing or shortness of breath. 10/24/16  Yes Jeffery, Chelle, PA-C  amLODipine (NORVASC) 10 MG tablet Take 1 tablet (10 mg total) by mouth daily. 10/28/17  Yes Jeffery, Chelle, PA-C  fluticasone (FLONASE) 50 MCG/ACT nasal spray Place 2 sprays into both nostrils daily. 03/11/17  Yes Jeffery, Chelle, PA-C  ibuprofen (ADVIL,MOTRIN) 800 MG tablet Take 1 tablet (800 mg total) by mouth every 8 (eight) hours as needed. 09/02/17  Yes Shelly Bombard, MD  loratadine (CLARITIN) 10 MG tablet Take 1 tablet (10 mg total) by mouth daily. May purchase over the counter for treatment of allergies. 12/13/14  Yes Niel Hummer, NP  promethazine (PHENERGAN) 25 MG tablet Take 1 tablet (25 mg total) by mouth every 8 (eight) hours as needed for nausea or vomiting. 08/22/17  Yes Jeffery, Chelle, PA-C  rizatriptan (MAXALT-MLT) 10 MG disintegrating tablet Take 1 tablet (10 mg total) by mouth as needed for migraine. May repeat in 2 hours if needed 08/22/17  Yes Jeffery, Chelle, PA-C   No Known Allergies     Objective:   Physical Exam  Constitutional: She appears well-developed and well-nourished.  HENT:  Head: Normocephalic and atraumatic.  Eyes: Pupils are equal, round, and reactive to light.  Cardiovascular: Normal rate, regular rhythm and normal heart sounds. Exam reveals no gallop and no friction rub.  No murmur  heard. Pulses:      Radial pulses are 2+ on the right side, and 2+ on the left side.  Pulmonary/Chest: Effort normal and breath sounds normal. No stridor. She has no wheezes. She has no rales.  Lymphadenopathy:    Cervical adenopathy: right sided lump.  Skin: Skin is warm and dry.          Assessment & Plan:  1. Essential hypertension -Systolic bp well controlled, but in light of consistently elevated diastolic bp, INCREASE amlodipine to 10 mg.  -Okay to proceed with scheduled hysterectomy. Will contact OBGYN to confirm.  - amLODipine (NORVASC) 10 MG tablet; Take 1 tablet (10 mg total) by mouth daily.  Dispense: 90 tablet; Refill: 3 Return in about 2 months (around 12/28/2017) for re-evaluation of blood pressure.

## 2017-10-28 NOTE — Assessment & Plan Note (Signed)
Increase amlodipine to 10 mg daily. OK to proceed with hysterectomy.

## 2017-10-29 ENCOUNTER — Telehealth: Payer: Self-pay | Admitting: *Deleted

## 2017-10-29 NOTE — Telephone Encounter (Signed)
Pt made aware that,per Dr Rip Harbour, pt does not need any other appts. He will forward info to [place] for scheduling. Pt advised to call back if she does not hear from scheduling re: surgery

## 2017-10-29 NOTE — Telephone Encounter (Signed)
Pt called to office stating that she was seen by Dr Rip Harbour for surgical consult in May.  Pt had issue with BP and recommended to see PCP for surgical clearance.  Pt states that she has been seen by PCP and was given medication.  Pt states that she has been given clearance for surgery. Pt states she was called by our office and was told she needed another consult visit.  Pt does not understand why she needs another consult visit.  Pt was under the impression that she would be scheduled once cleared by PCP.  Pt advised that message would be sent to provider for appt concerns and question about scheduling surgery.

## 2017-10-30 ENCOUNTER — Encounter (HOSPITAL_COMMUNITY): Payer: Self-pay

## 2017-11-03 ENCOUNTER — Telehealth: Payer: Self-pay | Admitting: *Deleted

## 2017-11-03 NOTE — Telephone Encounter (Signed)
-----   Message from Francia Greaves sent at 11/03/2017 11:54 AM EDT ----- Regarding: RE: Surgery Schedule She is scheduled for 06/25 @1500 . A letter was mailed out to here on 06/06. You will need to query Dr. Rip Harbour for estimated time out of work as I can not answer that question. ----- Message ----- From: Valene Bors, CMA Sent: 11/03/2017  11:29 AM To: [place] Subject: Surgery Schedule                               Drema Balzarine,  Just wanted to make sure that you had information on this pt regarding getting her scheduled for surgery. Pt needs a little more information in order to request time from work/turn in paperwork. Could you please call her and let her know where you are in process.  Thanks Vinnie Level

## 2017-11-03 NOTE — Telephone Encounter (Signed)
Pt called to office inquiring about surgical procedure. Pt states she has not talked with anyone about being scheduled. Pt states she has a process and paperwork she needs for her employer and needs more info [id number].  Message sent to [place] in order to follow up with pt about scheduling.

## 2017-11-03 NOTE — Telephone Encounter (Signed)
Pt made aware of surgery date/time and how to provide FMLA paperwork.

## 2017-11-04 DIAGNOSIS — Z3482 Encounter for supervision of other normal pregnancy, second trimester: Secondary | ICD-10-CM

## 2017-11-05 MED ORDER — ALBUTEROL SULFATE HFA 108 (90 BASE) MCG/ACT IN AERS: 1.0000 | INHALATION_SPRAY | Freq: Four times a day (QID) | RESPIRATORY_TRACT | 2 refills | Status: AC | PRN

## 2017-11-05 NOTE — Telephone Encounter (Signed)
Meds ordered this encounter  Medications  . albuterol (PROVENTIL HFA;VENTOLIN HFA) 108 (90 Base) MCG/ACT inhaler    Sig: Inhale 1-2 puffs into the lungs every 6 (six) hours as needed for wheezing or shortness of breath.    Dispense:  1 Inhaler    Refill:  2    Order Specific Question:   Supervising Provider    Answer:   Brigitte Pulse, EVA N [[id number]]

## 2017-11-05 NOTE — Telephone Encounter (Signed)
Patient was just here seeing Chelle does she need to come back in for OV for this medication.

## 2017-11-05 NOTE — Telephone Encounter (Signed)
Ventolin refill denied due to asthma not being addressed at office visits for over a year. Patient needs office visit for refill of medication. Please call patient to schedule.

## 2017-11-07 NOTE — Patient Instructions (Addendum)
Your procedure is scheduled on: Tuesday, June 25   Enter through the Main Entrance of [facility] at: 1:30 pm  Pick up the phone at the desk and dial [phone].  Call this number if you have problems the morning of surgery: [phone].  Remember: Do NOT eat food after midnight Monday   Do NOT drink clear liquids (including water) after: 9 am Tuesday, day of surgery  Take these medicines the morning of surgery with a SIP OF WATER: amlodipine and flonase nasal spray if needed.  Bring albuterol inhaler with you on day of surgery.  Stop herbal medications, vitamin supplements, ibuprofen/NSAIDS 1 week prior to surgery.  Do NOT wear jewelry (body piercing), metal hair clips/bobby pins, make-up, or nail polish. Do NOT wear lotions, powders, or perfumes.  You may wear deoderant. Do NOT shave for 48 hours prior to surgery. Do NOT bring valuables to the hospital. Contacts may not be worn into surgery.  Leave suitcase in car.  After surgery it may be brought to your room.  For patients admitted to the hospital, checkout time is 11:00 AM the day of discharge. Home with Daughter Debby Bud cell [phone]

## 2017-11-13 ENCOUNTER — Ambulatory Visit (INDEPENDENT_AMBULATORY_CARE_PROVIDER_SITE_OTHER): Payer: Managed Care, Other (non HMO) | Admitting: Obstetrics and Gynecology

## 2017-11-14 ENCOUNTER — Other Ambulatory Visit: Payer: Self-pay

## 2017-11-14 ENCOUNTER — Encounter (HOSPITAL_COMMUNITY)
Admission: RE | Admit: 2017-11-14 | Discharge: 2017-11-14 | Disposition: A | Discharge status: Home / Self Care | Payer: Managed Care, Other (non HMO) | Source: Ambulatory Visit | Attending: Obstetrics and Gynecology | Admitting: Obstetrics and Gynecology

## 2017-11-14 ENCOUNTER — Encounter (HOSPITAL_COMMUNITY): Payer: Self-pay

## 2017-11-14 DIAGNOSIS — Z01812 Encounter for preprocedural laboratory examination: Secondary | ICD-10-CM | POA: Insufficient documentation

## 2017-11-14 LAB — BASIC METABOLIC PANEL
ANION GAP: 11 (ref 5–15)
BUN: 11 mg/dL (ref 6–20)
CO2: 22 mmol/L (ref 22–32)
Calcium: 8.6 mg/dL — ABNORMAL LOW (ref 8.9–10.3)
Chloride: 100 mmol/L — ABNORMAL LOW (ref 101–111)
Creatinine, Ser: 0.91 mg/dL (ref 0.44–1.00)
Glucose, Bld: 100 mg/dL — ABNORMAL HIGH (ref 65–99)
POTASSIUM: 3.6 mmol/L (ref 3.5–5.1)
SODIUM: 133 mmol/L — AB (ref 135–145)

## 2017-11-14 LAB — CBC
HCT: 40.3 % (ref 36.0–46.0)
Hemoglobin: 13.5 g/dL (ref 12.0–15.0)
MCH: 28.2 pg (ref 26.0–34.0)
MCHC: 33.5 g/dL (ref 30.0–36.0)
MCV: 84.3 fL (ref 78.0–100.0)
Platelets: 285 10*3/uL (ref 150–400)
RBC: 4.78 MIL/uL (ref 3.87–5.11)
RDW: 13.2 % (ref 11.5–15.5)
WBC: 10.9 10*3/uL — AB (ref 4.0–10.5)

## 2017-11-14 LAB — TYPE AND SCREEN
ABO/RH(D): A POS
ANTIBODY SCREEN: NEGATIVE

## 2017-11-14 LAB — ABO/RH: ABO/RH(D): A POS

## 2017-11-14 NOTE — Pre-Procedure Instructions (Signed)
Patient had labs drawn by Jannette Spanner, PBT at PAT appointment.  Patient complained of pain down right arm at insertion of needle stick.  PBT removed needle, applied pressure and ice to site.  Informed Dr Lissa Hoard, Anesthesia.  Dr Lissa Hoard evaluated and spoke with patient.  Said patient was experiencing paresthesia.  Instructed to continue ice therapy and call if condition worsened.  Patient is schedule for surgery on Tuesday, June 25.  Patient verbalized understanding.

## 2017-11-18 ENCOUNTER — Ambulatory Visit (HOSPITAL_COMMUNITY): Payer: Managed Care, Other (non HMO) | Admitting: Anesthesiology

## 2017-11-18 ENCOUNTER — Encounter (HOSPITAL_COMMUNITY): Payer: Self-pay

## 2017-11-18 ENCOUNTER — Other Ambulatory Visit: Payer: Self-pay

## 2017-11-18 ENCOUNTER — Observation Stay (HOSPITAL_COMMUNITY)
Admission: RE | Admit: 2017-11-18 | Discharge: 2017-11-19 | Disposition: A | Discharge status: Home / Self Care | Payer: Managed Care, Other (non HMO) | Source: Ambulatory Visit | Attending: Obstetrics and Gynecology | Admitting: Obstetrics and Gynecology

## 2017-11-18 ENCOUNTER — Encounter (HOSPITAL_COMMUNITY)
Admission: RE | Disposition: A | Discharge status: Home / Self Care | Payer: Self-pay | Source: Ambulatory Visit | Attending: Obstetrics and Gynecology

## 2017-11-18 DIAGNOSIS — D259 Leiomyoma of uterus, unspecified: Secondary | ICD-10-CM

## 2017-11-18 DIAGNOSIS — Z8249 Family history of ischemic heart disease and other diseases of the circulatory system: Secondary | ICD-10-CM | POA: Diagnosis not present

## 2017-11-18 DIAGNOSIS — F332 Major depressive disorder, recurrent severe without psychotic features: Secondary | ICD-10-CM

## 2017-11-18 DIAGNOSIS — I10 Essential (primary) hypertension: Secondary | ICD-10-CM | POA: Diagnosis not present

## 2017-11-18 DIAGNOSIS — F329 Major depressive disorder, single episode, unspecified: Secondary | ICD-10-CM | POA: Diagnosis not present

## 2017-11-18 DIAGNOSIS — Z825 Family history of asthma and other chronic lower respiratory diseases: Secondary | ICD-10-CM | POA: Diagnosis not present

## 2017-11-18 DIAGNOSIS — Z801 Family history of malignant neoplasm of trachea, bronchus and lung: Secondary | ICD-10-CM | POA: Insufficient documentation

## 2017-11-18 DIAGNOSIS — N888 Other specified noninflammatory disorders of cervix uteri: Secondary | ICD-10-CM | POA: Diagnosis not present

## 2017-11-18 DIAGNOSIS — G43909 Migraine, unspecified, not intractable, without status migrainosus: Secondary | ICD-10-CM | POA: Diagnosis not present

## 2017-11-18 DIAGNOSIS — J45909 Unspecified asthma, uncomplicated: Secondary | ICD-10-CM | POA: Diagnosis not present

## 2017-11-18 DIAGNOSIS — Z806 Family history of leukemia: Secondary | ICD-10-CM | POA: Insufficient documentation

## 2017-11-18 DIAGNOSIS — N938 Other specified abnormal uterine and vaginal bleeding: Secondary | ICD-10-CM | POA: Diagnosis not present

## 2017-11-18 DIAGNOSIS — Z9889 Other specified postprocedural states: Secondary | ICD-10-CM

## 2017-11-18 DIAGNOSIS — F419 Anxiety disorder, unspecified: Secondary | ICD-10-CM | POA: Diagnosis not present

## 2017-11-18 DIAGNOSIS — Z823 Family history of stroke: Secondary | ICD-10-CM | POA: Diagnosis not present

## 2017-11-18 HISTORY — PX: VAGINAL HYSTERECTOMY: SHX2639

## 2017-11-18 LAB — HEMOGLOBIN AND HEMATOCRIT, BLOOD
HCT: 38.9 % (ref 36.0–46.0)
HEMOGLOBIN: 13.2 g/dL (ref 12.0–15.0)

## 2017-11-18 LAB — PREGNANCY, URINE: Preg Test, Ur: NEGATIVE

## 2017-11-18 SURGERY — HYSTERECTOMY, VAGINAL
Anesthesia: General | Site: Vagina | Laterality: Right

## 2017-11-18 MED ORDER — MEPERIDINE HCL 25 MG/ML IJ SOLN: 6.2500 mg | INTRAMUSCULAR | Status: DC | PRN

## 2017-11-18 MED ORDER — MIDAZOLAM HCL 2 MG/2ML IJ SOLN
INTRAMUSCULAR | Status: DC | PRN
  Administered 2017-11-18: 2 mg via INTRAVENOUS

## 2017-11-18 MED ORDER — ALBUTEROL SULFATE (2.5 MG/3ML) 0.083% IN NEBU: 2.5000 mg | INHALATION_SOLUTION | Freq: Four times a day (QID) | RESPIRATORY_TRACT | Status: DC | PRN

## 2017-11-18 MED ORDER — SENNA 8.6 MG PO TABS
1.0000 | ORAL_TABLET | Freq: Two times a day (BID) | ORAL | Status: DC
  Administered 2017-11-18 – 2017-11-19 (×2): 8.6 mg via ORAL
  Filled 2017-11-18 (×5): qty 1

## 2017-11-18 MED ORDER — KETOROLAC TROMETHAMINE 30 MG/ML IJ SOLN
INTRAMUSCULAR | Status: DC | PRN
  Administered 2017-11-18: 30 mg via INTRAVENOUS

## 2017-11-18 MED ORDER — FENTANYL CITRATE (PF) 100 MCG/2ML IJ SOLN
INTRAMUSCULAR | Status: AC
  Administered 2017-11-18: 50 ug via INTRAVENOUS
  Filled 2017-11-18: qty 2

## 2017-11-18 MED ORDER — LACTATED RINGERS IV SOLN
INTRAVENOUS | Status: DC
  Administered 2017-11-18: 125 mL/h via INTRAVENOUS

## 2017-11-18 MED ORDER — AMLODIPINE BESYLATE 10 MG PO TABS
10.0000 mg | ORAL_TABLET | Freq: Every day | ORAL | Status: DC
  Administered 2017-11-19: 10 mg via ORAL
  Filled 2017-11-18: qty 1

## 2017-11-18 MED ORDER — SCOPOLAMINE 1 MG/3DAYS TD PT72
MEDICATED_PATCH | TRANSDERMAL | Status: AC
  Administered 2017-11-18: 1.5 mg via TRANSDERMAL
  Filled 2017-11-18: qty 1

## 2017-11-18 MED ORDER — HYDROMORPHONE HCL 1 MG/ML IJ SOLN
1.0000 mg | INTRAMUSCULAR | Status: DC | PRN
  Administered 2017-11-18 (×3): 1 mg via INTRAVENOUS
  Filled 2017-11-18 (×3): qty 1

## 2017-11-18 MED ORDER — SUGAMMADEX SODIUM 200 MG/2ML IV SOLN
INTRAVENOUS | Status: DC | PRN
  Administered 2017-11-18: 198.8 mg via INTRAVENOUS

## 2017-11-18 MED ORDER — PROPOFOL 10 MG/ML IV BOLUS
INTRAVENOUS | Status: DC | PRN
  Administered 2017-11-18: 200 mg via INTRAVENOUS

## 2017-11-18 MED ORDER — KETOROLAC TROMETHAMINE 30 MG/ML IJ SOLN
30.0000 mg | Freq: Four times a day (QID) | INTRAMUSCULAR | Status: AC
  Administered 2017-11-18 – 2017-11-19 (×4): 30 mg via INTRAVENOUS
  Filled 2017-11-18 (×4): qty 1

## 2017-11-18 MED ORDER — ROCURONIUM BROMIDE 100 MG/10ML IV SOLN
INTRAVENOUS | Status: DC | PRN
  Administered 2017-11-18: 50 mg via INTRAVENOUS

## 2017-11-18 MED ORDER — SOD CITRATE-CITRIC ACID 500-334 MG/5ML PO SOLN
ORAL | Status: AC
  Administered 2017-11-18: 30 mL via ORAL
  Filled 2017-11-18: qty 15

## 2017-11-18 MED ORDER — ZOLPIDEM TARTRATE 5 MG PO TABS: 5.0000 mg | ORAL_TABLET | Freq: Every evening | ORAL | Status: DC | PRN

## 2017-11-18 MED ORDER — LACTATED RINGERS IV SOLN
INTRAVENOUS | Status: DC
  Administered 2017-11-18 – 2017-11-19 (×3): via INTRAVENOUS

## 2017-11-18 MED ORDER — LACTATED RINGERS IV SOLN
INTRAVENOUS | Status: DC
  Administered 2017-11-18: 14:00:00 via INTRAVENOUS

## 2017-11-18 MED ORDER — IBUPROFEN 800 MG PO TABS
800.0000 mg | ORAL_TABLET | Freq: Three times a day (TID) | ORAL | Status: DC
  Administered 2017-11-19: 800 mg via ORAL
  Filled 2017-11-18: qty 1

## 2017-11-18 MED ORDER — ALBUTEROL SULFATE HFA 108 (90 BASE) MCG/ACT IN AERS: 1.0000 | INHALATION_SPRAY | Freq: Four times a day (QID) | RESPIRATORY_TRACT | Status: DC | PRN

## 2017-11-18 MED ORDER — FENTANYL CITRATE (PF) 250 MCG/5ML IJ SOLN
INTRAMUSCULAR | Status: AC
  Filled 2017-11-18: qty 5

## 2017-11-18 MED ORDER — PHENYLEPHRINE HCL 10 MG/ML IJ SOLN
INTRAMUSCULAR | Status: DC | PRN
  Administered 2017-11-18: 80 mg via INTRAVENOUS
  Administered 2017-11-18: 40 mg via INTRAVENOUS

## 2017-11-18 MED ORDER — DEXAMETHASONE SODIUM PHOSPHATE 10 MG/ML IJ SOLN
INTRAMUSCULAR | Status: DC | PRN
  Administered 2017-11-18: 10 mg via INTRAVENOUS

## 2017-11-18 MED ORDER — GLYCOPYRROLATE 0.2 MG/ML IJ SOLN
INTRAMUSCULAR | Status: DC | PRN
  Administered 2017-11-18: 0.1 mg via INTRAVENOUS

## 2017-11-18 MED ORDER — SIMETHICONE 80 MG PO CHEW: 80.0000 mg | CHEWABLE_TABLET | Freq: Four times a day (QID) | ORAL | Status: DC | PRN

## 2017-11-18 MED ORDER — ONDANSETRON HCL 4 MG PO TABS: 4.0000 mg | ORAL_TABLET | Freq: Four times a day (QID) | ORAL | Status: DC | PRN

## 2017-11-18 MED ORDER — LIDOCAINE HCL (CARDIAC) PF 100 MG/5ML IV SOSY
PREFILLED_SYRINGE | INTRAVENOUS | Status: DC | PRN
  Administered 2017-11-18: 100 mg via INTRAVENOUS

## 2017-11-18 MED ORDER — FENTANYL CITRATE (PF) 100 MCG/2ML IJ SOLN
INTRAMUSCULAR | Status: DC | PRN
  Administered 2017-11-18: 100 ug via INTRAVENOUS
  Administered 2017-11-18: 50 ug via INTRAVENOUS
  Administered 2017-11-18: 100 ug via INTRAVENOUS

## 2017-11-18 MED ORDER — KETOROLAC TROMETHAMINE 30 MG/ML IJ SOLN: 30.0000 mg | Freq: Four times a day (QID) | INTRAMUSCULAR | Status: AC

## 2017-11-18 MED ORDER — LACTATED RINGERS IV SOLN
INTRAVENOUS | Status: DC
  Administered 2017-11-18 (×2): via INTRAVENOUS

## 2017-11-18 MED ORDER — METOCLOPRAMIDE HCL 5 MG/ML IJ SOLN: 10.0000 mg | Freq: Once | INTRAMUSCULAR | Status: DC | PRN

## 2017-11-18 MED ORDER — CEFAZOLIN SODIUM-DEXTROSE 2-4 GM/100ML-% IV SOLN
2.0000 g | INTRAVENOUS | Status: AC
  Administered 2017-11-18: 2 g via INTRAVENOUS

## 2017-11-18 MED ORDER — CEFAZOLIN SODIUM-DEXTROSE 2-4 GM/100ML-% IV SOLN
INTRAVENOUS | Status: AC
  Filled 2017-11-18: qty 100

## 2017-11-18 MED ORDER — FENTANYL CITRATE (PF) 100 MCG/2ML IJ SOLN
25.0000 ug | INTRAMUSCULAR | Status: DC | PRN
  Administered 2017-11-18 (×2): 50 ug via INTRAVENOUS

## 2017-11-18 MED ORDER — OXYCODONE-ACETAMINOPHEN 5-325 MG PO TABS
1.0000 | ORAL_TABLET | ORAL | Status: DC | PRN
  Administered 2017-11-19 (×2): 1 via ORAL
  Filled 2017-11-18 (×2): qty 1

## 2017-11-18 MED ORDER — ONDANSETRON HCL 4 MG/2ML IJ SOLN
4.0000 mg | Freq: Four times a day (QID) | INTRAMUSCULAR | Status: DC | PRN
  Administered 2017-11-18: 4 mg via INTRAVENOUS
  Filled 2017-11-18: qty 2

## 2017-11-18 MED ORDER — PHENYLEPHRINE 40 MCG/ML (10ML) SYRINGE FOR IV PUSH (FOR BLOOD PRESSURE SUPPORT)
PREFILLED_SYRINGE | INTRAVENOUS | Status: AC
  Filled 2017-11-18: qty 10

## 2017-11-18 MED ORDER — SCOPOLAMINE 1 MG/3DAYS TD PT72
1.0000 | MEDICATED_PATCH | Freq: Once | TRANSDERMAL | Status: DC
  Administered 2017-11-18: 1.5 mg via TRANSDERMAL

## 2017-11-18 MED ORDER — FENTANYL CITRATE (PF) 100 MCG/2ML IJ SOLN
INTRAMUSCULAR | Status: AC
  Filled 2017-11-18: qty 2

## 2017-11-18 MED ORDER — SOD CITRATE-CITRIC ACID 500-334 MG/5ML PO SOLN
30.0000 mL | ORAL | Status: AC
  Administered 2017-11-18: 30 mL via ORAL

## 2017-11-18 MED ORDER — MIDAZOLAM HCL 2 MG/2ML IJ SOLN
INTRAMUSCULAR | Status: AC
  Filled 2017-11-18: qty 2

## 2017-11-18 SURGICAL SUPPLY — 22 items
BLADE SURG 10 STRL SS (BLADE) ×3 IMPLANT
CANISTER SUCT 3000ML PPV (MISCELLANEOUS) ×3 IMPLANT
CONT PATH 16OZ SNAP LID 3702 (MISCELLANEOUS) IMPLANT
DECANTER SPIKE VIAL GLASS SM (MISCELLANEOUS) IMPLANT
GLOVE BIO SURGEON STRL SZ7.5 (GLOVE) ×3 IMPLANT
GLOVE BIO SURGEON STRL SZ8 (GLOVE) ×3 IMPLANT
GLOVE BIOGEL PI IND STRL 6.5 (GLOVE) ×1 IMPLANT
GLOVE BIOGEL PI IND STRL 7.0 (GLOVE) ×2 IMPLANT
GLOVE BIOGEL PI INDICATOR 6.5 (GLOVE) ×2
GLOVE BIOGEL PI INDICATOR 7.0 (GLOVE) ×4
GOWN STRL REUS W/TWL LRG LVL3 (GOWN DISPOSABLE) ×9 IMPLANT
GOWN STRL REUS W/TWL XL LVL3 (GOWN DISPOSABLE) ×3 IMPLANT
NS IRRIG 1000ML POUR BTL (IV SOLUTION) ×3 IMPLANT
PACK VAGINAL WOMENS (CUSTOM PROCEDURE TRAY) ×3 IMPLANT
PAD OB MATERNITY 4.3X12.25 (PERSONAL CARE ITEMS) ×3 IMPLANT
SUT VIC AB 2-0 CT1 18 (SUTURE) ×3 IMPLANT
SUT VIC AB 2-0 CT1 27 (SUTURE)
SUT VIC AB 2-0 CT1 TAPERPNT 27 (SUTURE) IMPLANT
SUT VIC AB PLUS 45CM 1-MO-4 (SUTURE) ×6 IMPLANT
SUT VICRYL 1 TIES 12X18 (SUTURE) ×3 IMPLANT
TOWEL OR 17X24 6PK STRL BLUE (TOWEL DISPOSABLE) ×6 IMPLANT
TRAY FOLEY W/BAG SLVR 14FR (SET/KITS/TRAYS/PACK) ×3 IMPLANT

## 2017-11-18 NOTE — Progress Notes (Signed)
Pt didn't do well getting up to BR foley remains in will try to get her up again to see how she does.

## 2017-11-18 NOTE — Op Note (Signed)
Jane Snyder PROCEDURE DATE: 11/18/2017  PREOPERATIVE DIAGNOSIS:  Symptomatic fibroids, AUB POSTOPERATIVE DIAGNOSIS:  Symptomatic fibroids, AUB SURGEON:   Arlina Robes, M.D. ASSISTANT: Clovia Cuff, M.D. OPERATION:  Total Vaginal hysterectomy and right salpingectomy ANESTHESIA:  General endotracheal.  INDICATIONS: The patient is a 47 y.o. [id number] with history of symptomatic uterine fibroids/uterine fibroids. The patient made a decision to undergo definite surgical treatment. On the preoperative visit, the risks, benefits, indications, and alternatives of the procedure were reviewed with the patient.  On the day of surgery, the risks of surgery were again discussed with the patient including but not limited to: bleeding which may require transfusion or reoperation; infection which may require antibiotics; injury to bowel, bladder, ureters or other surrounding organs; need for additional procedures; thromboembolic phenomenon, incisional problems and other postoperative/anesthesia complications. Written informed consent was obtained.    OPERATIVE FINDINGS: A 10 week size uterus with normal right tubes and ovary. Unable to see left tube and ovary  ESTIMATED BLOOD LOSS: 200 ml FLUIDS:  800 ml of Lactated Ringers URINE OUTPUT:  200 ml of clear yellow urine. SPECIMENS:  Uterus, cervix and right tube sent to pathology COMPLICATIONS:  None immediate.  DESCRIPTION OF PROCEDURE:  The patient received intravenous antibiotics and had sequential compression devices applied to her lower extremities while in the preoperative area.  She was then taken to the operating room where general anesthesia was administered and was found to be adequate.  She was placed in the dorsal lithotomy position, and was prepped and draped in a sterile manner.  A Foley catheter was inserted into her bladder and attached to constant drainage. After an adequate timeout was performed, attention was turned to her pelvis.  A weighted  speculum was then placed in the vagina, and the posterior lip of the cervix were grasped. The vaginal mucosa was grasped and cut. Entry was gained sharply posteriorly. A long weighted speculum was inserted into the posterior cul de sac.  The anterior lip of the cervix was grasped. The vaginal mucosa was then cut circumferentially. The endopelvic fascia was grasped and cut. The anterior peritoneum was identified grasped and cut. Entry was gained anteriorly. The uterosacral ligaments were clamped with Ziplin clamps, cut and ligated with # 1 Vicryl. This was repeated on the contralateral side. The parametrium and cardinal ligaments were clamped with Ziplin clamps , cut and ligated. Just passed the uterine arteries bilaterally, the uterus was bivalved and morcellated to allow better exposure. The final pedicle involving the uteroovarian was clamped, cut and ligated with a free tie and a Bonny tie. The procedure was repeated on the opposite side. The specimen was removed. The pedicles were reviewed and additional sutures were placed for hemostasis.  I was unable to visualize the left tube and ovary. The right tube was identified and clamped, cut and ligated with a free tie.  All pedicles from the uterosacral ligament to the cornua were examined hemostasis was confirmed.  The peritoneum was closed in purse string fashion. The vaginal cuff was then closed with 2/0 Vicryl.  All instruments were then removed from the pelvis.  The patient tolerated the procedure well.  All instruments, needles, and sponge counts were correct x 2. The patient was taken to the recovery room in stable condition.    An experienced assistant was required given the standard of surgical care given the complexity of the case.  This assistant was needed for exposure, dissection, suctioning, retraction and for overall help during the procedure.  Legrand Como  Rip Harbour, MD, [facility], [facility]

## 2017-11-18 NOTE — Transfer of Care (Signed)
Immediate Anesthesia Transfer of Care Note  Patient: Jane Snyder  Procedure(s) Performed: HYSTERECTOMY VAGINAL WITH RIGHT  SALPINGECTOMY (Right Vagina )  Patient Location: PACU  Anesthesia Type:General  Level of Consciousness: awake, alert  and oriented  Airway & Oxygen Therapy: Patient Spontanous Breathing and Patient connected to nasal cannula oxygen  Post-op Assessment: Report given to RN and Post -op Vital signs reviewed and stable  Post vital signs: Reviewed and stable  Last Vitals:  Vitals Value Taken Time  BP 120/72 11/18/2017 12:36 PM  Temp    Pulse 92 11/18/2017 12:39 PM  Resp 17 11/18/2017 12:39 PM  SpO2 95 % 11/18/2017 12:39 PM  Vitals shown include unvalidated device data.  Last Pain:  Vitals:   11/18/17 0929  TempSrc: Oral  PainSc: 4       Patients Stated Pain Goal: 2 (07/61/51 8343)  Complications: No apparent anesthesia complications
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
## 2017-11-18 NOTE — Anesthesia Procedure Notes (Signed)
Procedure Name: Intubation Date/Time: 11/18/2017 11:06 AM Performed by: Genevie Ann, CRNA Pre-anesthesia Checklist: Patient identified, Emergency Drugs available, Suction available, Patient being monitored and Timeout performed Patient Re-evaluated:Patient Re-evaluated prior to induction Oxygen Delivery Method: Circle system utilized Preoxygenation: Pre-oxygenation with 100% oxygen Induction Type: IV induction Ventilation: Mask ventilation without difficulty Laryngoscope Size: Mac and 3 Grade View: Grade II Number of attempts: 2 Placement Confirmation: ETT inserted through vocal cords under direct vision,  positive ETCO2 and breath sounds checked- equal and bilateral Secured at: 20 cm Dental Injury: Teeth and Oropharynx as per pre-operative assessment

## 2017-11-18 NOTE — Interval H&P Note (Signed)
History and Physical Interval Note:  11/18/2017 10:30 AM  Jane Snyder  has presented today for surgery, with the diagnosis of AUB  Fibroids  The various methods of treatment have been discussed with the patient and family. After consideration of risks, benefits and other options for treatment, the patient has consented to  Procedure(s): HYSTERECTOMY VAGINAL WITH SALPINGECTOMY (Bilateral) as a surgical intervention .  The patient's history has been reviewed, patient examined, no change in status, stable for surgery.  I have reviewed the patient's chart and labs.  Questions were answered to the patient's satisfaction.     Chancy Milroy

## 2017-11-18 NOTE — Progress Notes (Signed)
Patient ID: Jane Snyder, female   DOB: 09/05/1970, 47 y.o.   MRN: [id number] DOS TVH/RS  Pt reports some cramps but pain medication helps. Tolerating ice chips.  PE AF VSS Good UOP Lungs clear Heart RRR Abd soft faint BS GU foley no bleeding  Ext SCD's  Labs Pending  A/P Stable Continue with supportive care

## 2017-11-18 NOTE — Anesthesia Preprocedure Evaluation (Signed)
Anesthesia Evaluation  Patient identified by MRN, date of birth, ID band Patient awake    Reviewed: Allergy & Precautions, NPO status , Patient's Chart, lab work & pertinent test results  Airway Mallampati: II  TM Distance: >3 FB Neck ROM: Full    Dental no notable dental hx.    Pulmonary asthma ,    Pulmonary exam normal breath sounds clear to auscultation       Cardiovascular hypertension, Pt. on medications Normal cardiovascular exam Rhythm:Regular Rate:Normal     Neuro/Psych negative neurological ROS  negative psych ROS   GI/Hepatic negative GI ROS, Neg liver ROS,   Endo/Other  negative endocrine ROS  Renal/GU negative Renal ROS  negative genitourinary   Musculoskeletal negative musculoskeletal ROS (+)   Abdominal   Peds negative pediatric ROS (+)  Hematology negative hematology ROS (+)   Anesthesia Other Findings   Reproductive/Obstetrics negative OB ROS                             Anesthesia Physical Anesthesia Plan  ASA: II  Anesthesia Plan: General   Post-op Pain Management:    Induction: Intravenous  PONV Risk Score and Plan: 3 and Ondansetron, Treatment may vary due to age or medical condition, Scopolamine patch - Pre-op, Midazolam and Dexamethasone  Airway Management Planned: Oral ETT  Additional Equipment:   Intra-op Plan:   Post-operative Plan: Extubation in OR  Informed Consent: I have reviewed the patients History and Physical, chart, labs and discussed the procedure including the risks, benefits and alternatives for the proposed anesthesia with the patient or authorized representative who has indicated his/her understanding and acceptance.   Dental advisory given  Plan Discussed with: CRNA  Anesthesia Plan Comments:         Anesthesia Quick Evaluation

## 2017-11-19 ENCOUNTER — Encounter (HOSPITAL_COMMUNITY): Payer: Self-pay | Admitting: Obstetrics and Gynecology

## 2017-11-19 DIAGNOSIS — N888 Other specified noninflammatory disorders of cervix uteri: Secondary | ICD-10-CM | POA: Diagnosis not present

## 2017-11-19 LAB — CBC
HEMATOCRIT: 36.6 % (ref 36.0–46.0)
HEMOGLOBIN: 12.3 g/dL (ref 12.0–15.0)
MCH: 28 pg (ref 26.0–34.0)
MCHC: 33.6 g/dL (ref 30.0–36.0)
MCV: 83.2 fL (ref 78.0–100.0)
Platelets: 292 10*3/uL (ref 150–400)
RBC: 4.4 MIL/uL (ref 3.87–5.11)
RDW: 13.5 % (ref 11.5–15.5)
WBC: 16 10*3/uL — ABNORMAL HIGH (ref 4.0–10.5)

## 2017-11-19 LAB — BASIC METABOLIC PANEL
ANION GAP: 12 (ref 5–15)
BUN: 10 mg/dL (ref 6–20)
CO2: 20 mmol/L — ABNORMAL LOW (ref 22–32)
Calcium: 8.4 mg/dL — ABNORMAL LOW (ref 8.9–10.3)
Chloride: 99 mmol/L (ref 98–111)
Creatinine, Ser: 0.82 mg/dL (ref 0.44–1.00)
Glucose, Bld: 148 mg/dL — ABNORMAL HIGH (ref 70–99)
POTASSIUM: 3.6 mmol/L (ref 3.5–5.1)
Sodium: 131 mmol/L — ABNORMAL LOW (ref 135–145)

## 2017-11-19 MED ORDER — OXYCODONE-ACETAMINOPHEN 5-325 MG PO TABS: 1.0000 | ORAL_TABLET | ORAL | 0 refills | Status: DC | PRN

## 2017-11-19 MED ORDER — IBUPROFEN 800 MG PO TABS: 800.0000 mg | ORAL_TABLET | Freq: Three times a day (TID) | ORAL | 1 refills | Status: DC

## 2017-11-19 NOTE — Anesthesia Postprocedure Evaluation (Signed)
Anesthesia Post Note  Patient: Jane Snyder  Procedure(s) Performed: HYSTERECTOMY VAGINAL WITH RIGHT  SALPINGECTOMY (Right Vagina )     Patient location during evaluation: [facility] Anesthesia Type: General Level of consciousness: awake and alert and oriented Pain management: pain level controlled Vital Signs Assessment: post-procedure vital signs reviewed and stable Respiratory status: spontaneous breathing, nonlabored ventilation and respiratory function stable Cardiovascular status: stable Postop Assessment: adequate PO intake and able to ambulate Anesthetic complications: no    Last Vitals:  Vitals:   11/18/17 2328 11/19/17 0434  BP: 130/75 (!) 155/89  Pulse: 78 66  Resp: 16 17  Temp: 36.8 C 37 C  SpO2: 98% 97%    Last Pain:  Vitals:   11/19/17 0642  TempSrc:   PainSc: 2    Pain Goal: Patients Stated Pain Goal: 2 (11/19/17 0642)               Jabier Mutton

## 2017-11-19 NOTE — Discharge Summary (Signed)
Physician Discharge Summary  Patient ID: Jane Snyder MRN: [id number] DOB/AGE: 45/18/1972 45 y.o.  Admit date: 11/18/2017 Discharge date: 11/19/2017  Admission Diagnoses: Uterine Fibroids and AUB  Discharge Diagnoses:  Active Problems:   Post-operative state Plus SAA  Discharged Condition: good  Hospital Course: Jane Snyder was admitted with above Dx. Underwent TVH with RS. See OP note for additional information. Post op course was unremarkable. Pt progressed to tolerating diet, voiding, + flatus and good oral pain control. Felt amendable for discharge home on POD # 1. Discharge medications and instructions were reviewed with pt. She verbalized understanding.  Consults: None  Significant Diagnostic Studies: labs  Treatments: surgery: TVH/RS  Discharge Exam: Blood pressure (!) 155/89, pulse 66, temperature 98.6 F (37 C), temperature source Oral, resp. rate 17, height 5\' 4"  (1.626 m), weight 219 lb 2 oz (99.4 kg), last menstrual period 10/27/2017, SpO2 97 %. Lungs clear Heart RRR Abd soft + BS GU no bleeding Ext non tender  Disposition: Discharge disposition: 01-Home or Self Care       Discharge Instructions    Call MD for:  difficulty breathing, headache or visual disturbances   Complete by:  As directed    Call MD for:  extreme fatigue   Complete by:  As directed    Call MD for:  hives   Complete by:  As directed    Call MD for:  persistant dizziness or light-headedness   Complete by:  As directed    Call MD for:  persistant nausea and vomiting   Complete by:  As directed    Call MD for:  redness, tenderness, or signs of infection (pain, swelling, redness, odor or green/yellow discharge around incision site)   Complete by:  As directed    Call MD for:  severe uncontrolled pain   Complete by:  As directed    Call MD for:  temperature >100.4   Complete by:  As directed    Diet - low sodium heart healthy   Complete by:  As directed    Increase activity  slowly   Complete by:  As directed    Sexual Activity Restrictions   Complete by:  As directed    Pelvic rest x 6 weeks     Allergies as of 11/19/2017   No Known Allergies     Medication List    TAKE these medications   albuterol 108 (90 Base) MCG/ACT inhaler Commonly known as:  PROVENTIL HFA;VENTOLIN HFA Inhale 1-2 puffs into the lungs every 6 (six) hours as needed for wheezing or shortness of breath.   amLODipine 10 MG tablet Commonly known as:  NORVASC Take 1 tablet (10 mg total) by mouth daily.   fluticasone 50 MCG/ACT nasal spray Commonly known as:  FLONASE Place 2 sprays into both nostrils daily. What changed:    when to take this  reasons to take this   ibuprofen 800 MG tablet Commonly known as:  ADVIL,MOTRIN Take 1 tablet (800 mg total) by mouth 3 (three) times daily. What changed:    when to take this  reasons to take this   oxyCODONE-acetaminophen 5-325 MG tablet Commonly known as:  PERCOCET/ROXICET Take 1 tablet by mouth every 4 (four) hours as needed for moderate pain ((when tolerating fluids)).   promethazine 25 MG tablet Commonly known as:  PHENERGAN Take 1 tablet (25 mg total) by mouth every 8 (eight) hours as needed for nausea or vomiting.   rizatriptan 10 MG disintegrating tablet Commonly known as:  MAXALT-MLT Take 1 tablet (10 mg total) by mouth as needed for migraine. May repeat in 2 hours if needed What changed:    when to take this  additional instructions      Follow-up [place]. Schedule an appointment as soon as possible for a visit in 4 week(s).   Specialty:  Obstetrics and Gynecology Why:  Post OP appt with Dr. Gardiner Fanti Contact information: [address], [place] [address] [place]          Signed: Chancy Milroy 11/19/2017, 7:55 AM

## 2017-11-19 NOTE — Discharge Instructions (Signed)
Vaginal Hysterectomy, Care After °Refer to this sheet in the next few weeks. These instructions provide you with information about caring for yourself after your procedure. Your health care provider may also give you more specific instructions. Your treatment has been planned according to current medical practices, but problems sometimes occur. Call your health care provider if you have any problems or questions after your procedure. °What can I expect after the procedure? °After the procedure, it is common to have: °· Pain. °· Soreness and numbness in your incision areas. °· Vaginal bleeding and discharge. °· Constipation. °· Temporary problems emptying the bladder. °· Feelings of sadness or other emotions. ° °Follow these instructions at home: °Medicines °· Take over-the-counter and prescription medicines only as told by your health care provider. °· If you were prescribed an antibiotic medicine, take it as told by your health care provider. Do not stop taking the antibiotic even if you start to feel better. °· Do not drive or operate heavy machinery while taking prescription pain medicine. °Activity °· Return to your normal activities as told by your health care provider. Ask your health care provider what activities are safe for you. °· Get regular exercise as told by your health care provider. You may be told to take short walks every day and go farther each time. °· Do not lift anything that is heavier than 10 lb (4.5 kg). °General instructions ° °· Do not put anything in your vagina for 6 weeks after your surgery or as told by your health care provider. This includes tampons and douches. °· Do not have sex until your health care provider says you can. °· Do not take baths, swim, or use a hot tub until your health care provider approves. °· Drink enough fluid to keep your urine clear or pale yellow. °· Do not drive for 24 hours if you were given a sedative. °· Keep all follow-up visits as told by your health  care provider. This is important. °Contact a health care provider if: °· Your pain medicine is not helping. °· You have a fever. °· You have redness, swelling, or pain at your incision site. °· You have blood, pus, or a bad-smelling discharge from your vagina. °· You continue to have difficulty urinating. °Get help right away if: °· You have severe abdominal or back pain. °· You have heavy bleeding from your vagina. °· You have chest pain or shortness of breath. °This information is not intended to replace advice given to you by your health care provider. Make sure you discuss any questions you have with your health care provider. °Document Released: 09/04/2015 Document Revised: 10/19/2015 Document Reviewed: 05/28/2015 °Elsevier Interactive Patient Education © 2018 [employer] ° °

## 2017-11-19 NOTE — Progress Notes (Signed)
Pt  Out in wheelchair   Teaching complete  

## 2017-12-09 ENCOUNTER — Ambulatory Visit (INDEPENDENT_AMBULATORY_CARE_PROVIDER_SITE_OTHER): Payer: Managed Care, Other (non HMO) | Admitting: Obstetrics and Gynecology

## 2017-12-09 ENCOUNTER — Encounter: Payer: Self-pay | Admitting: Obstetrics and Gynecology

## 2017-12-09 VITALS — BP 121/81 | HR 79 | Wt 216.0 lb

## 2017-12-09 DIAGNOSIS — Z9889 Other specified postprocedural states: Secondary | ICD-10-CM

## 2017-12-09 NOTE — Patient Instructions (Signed)

## 2017-12-09 NOTE — Progress Notes (Signed)
Jane Snyder is here for post op appt. S/p TVH/RS on 11/18/17. She reports occ pain, relieved with Motrin. Denies bowel or bladder dysfunction. Pathology reviewed with pt.  PE AF VSS Lungs clear Heart RRR Abd soft + BS GU Nl EGBUS cuff healing well no masses or tenderness  A/P Post Op appt  Doing well. Continue with pelvic rest x 6 weeks Return to work note provided. F/U in 1 yr or PRN

## 2018-02-02 IMAGING — US US TRANSVAGINAL NON-OB
1 series · 15 of 25 positions shown · non-contrast
Comparison: 03/15/2005 CT abdomen/pelvis. Report from 12/22/2001
pelvic MRI (images not available).

CLINICAL DATA: 44-year-old female with a history of endometrial
ablation in 6988 presents with dysfunctional uterine bleeding.

EXAM:
TRANSABDOMINAL AND TRANSVAGINAL ULTRASOUND OF PELVIS
TECHNIQUE: Both transabdominal and transvaginal ultrasound examinations of the
pelvis were performed. Transabdominal technique was performed for
global imaging of the pelvis including uterus, ovaries, adnexal
regions, and pelvic cul-de-sac. It was necessary to proceed with
endovaginal exam following the transabdominal exam to visualize the
ablated endometrium and adnexa.

[Series 1: us transvaginal non-ob · 15 of 50 slices shown]
[im 1/50]
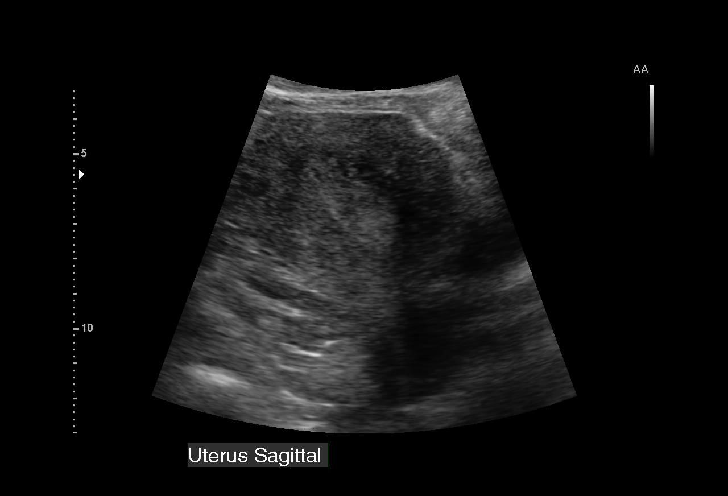
[im 5/50]
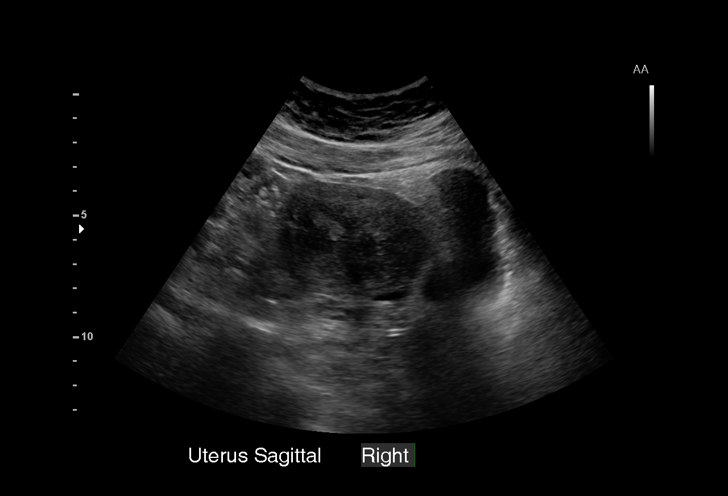
[im 9/50]
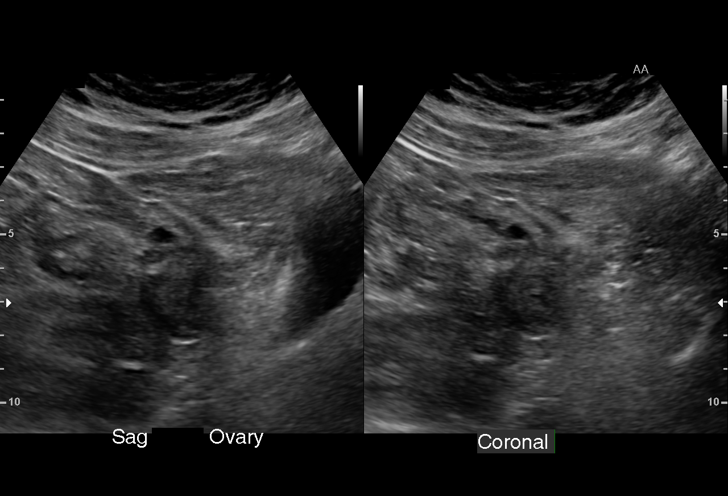
[im 11/50]
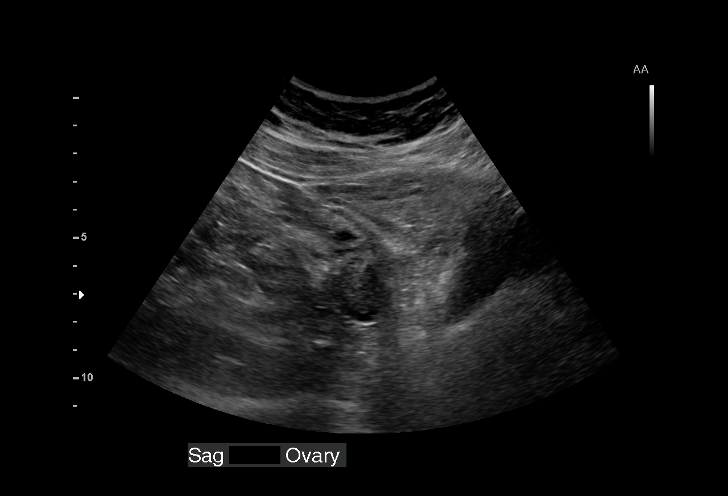
[im 15/50]
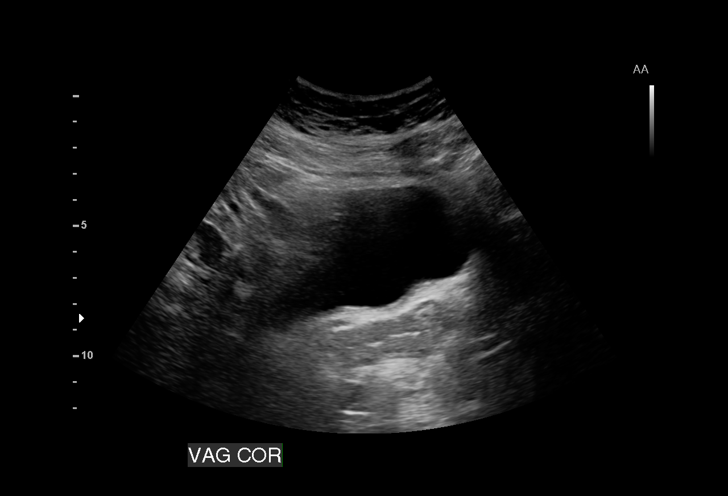
[im 19/50]
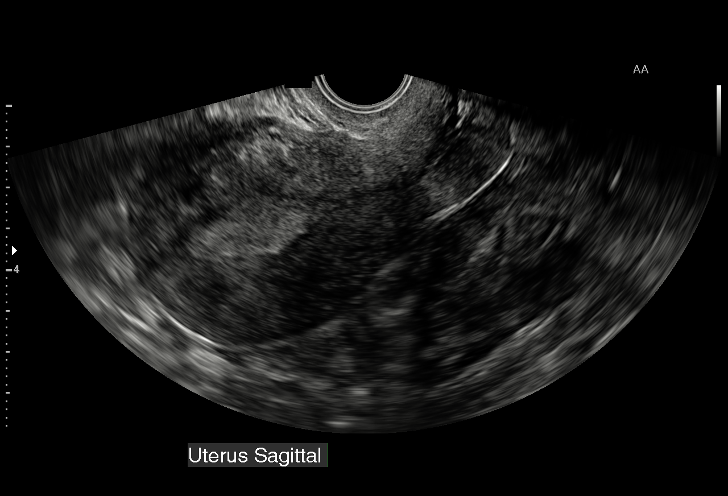
[im 21/50]
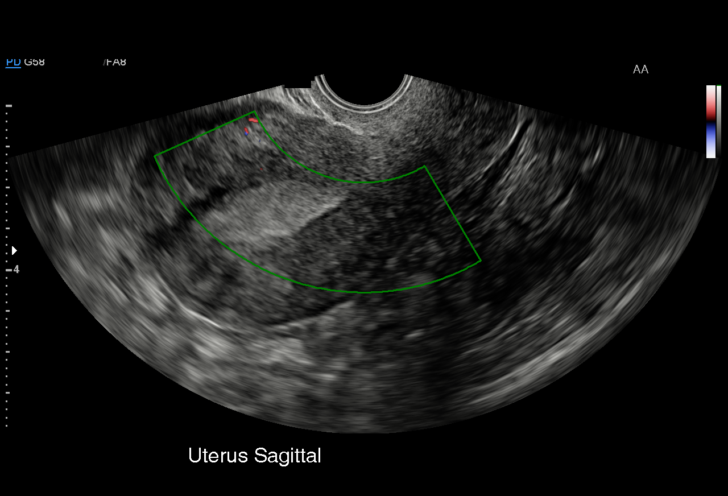
[im 25/50]
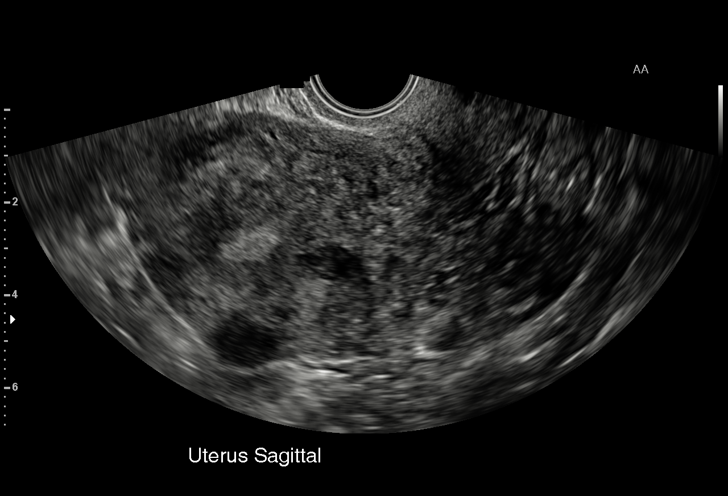
[im 29/50]
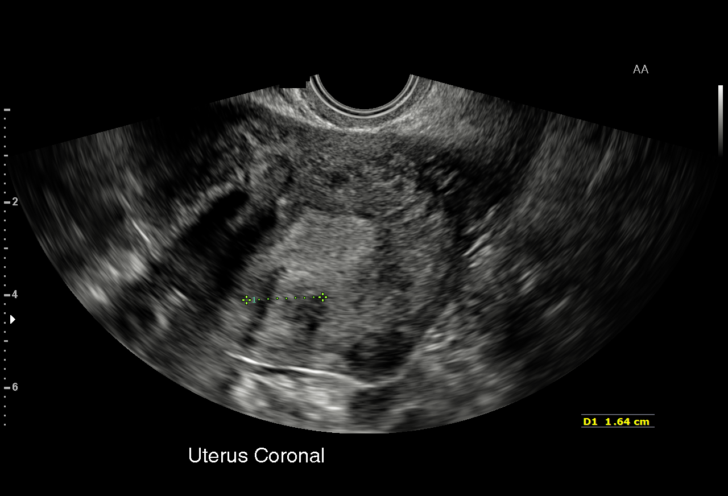
[im 31/50]
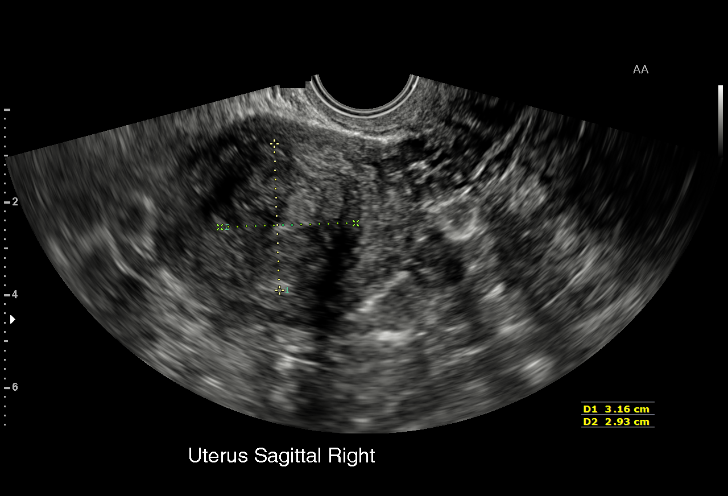
[im 35/50]
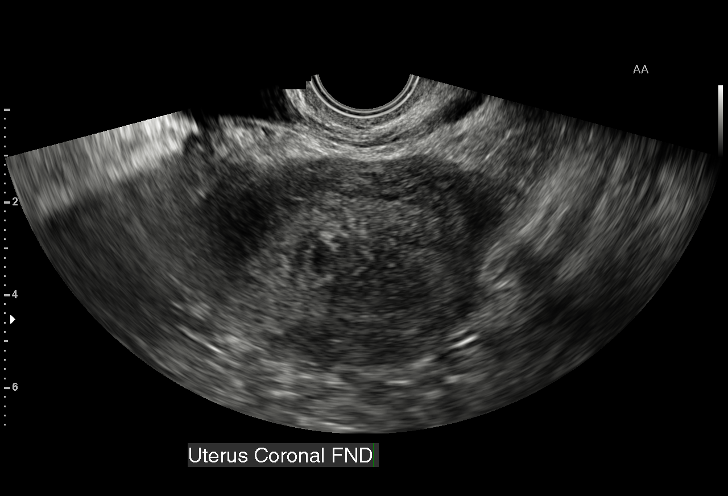
[im 39/50]
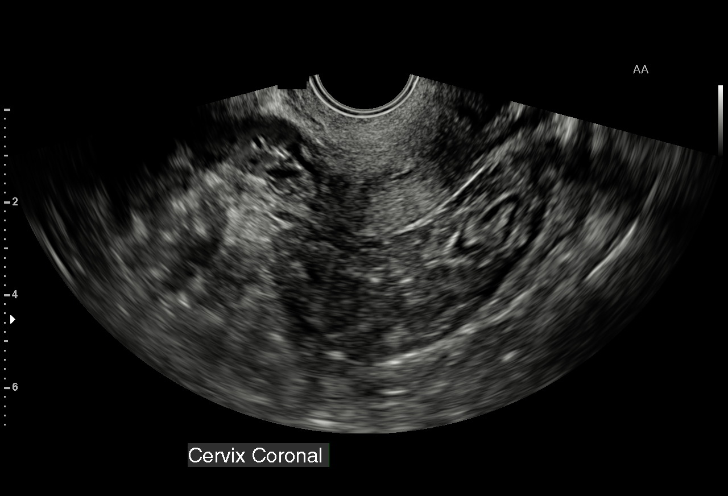
[im 41/50]
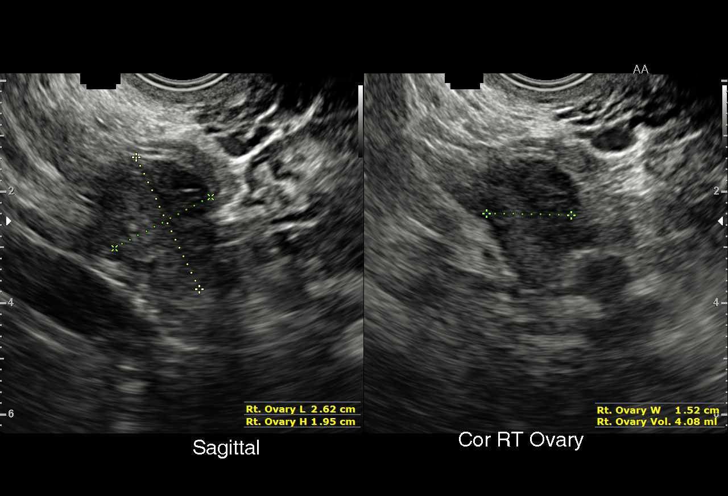
[im 45/50]
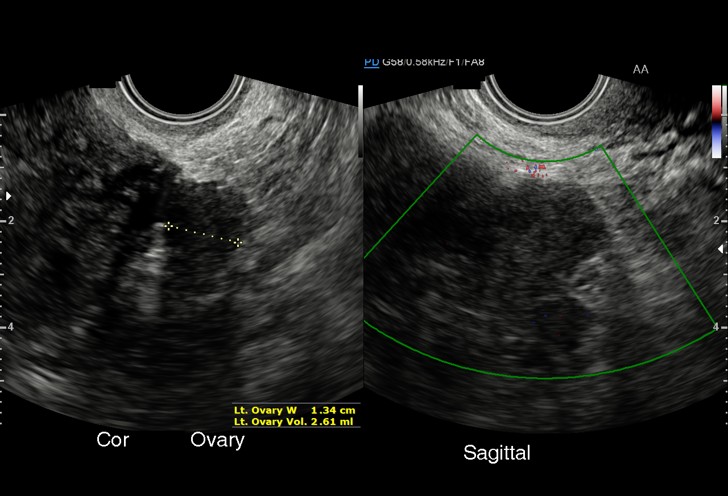
[im 50/50]
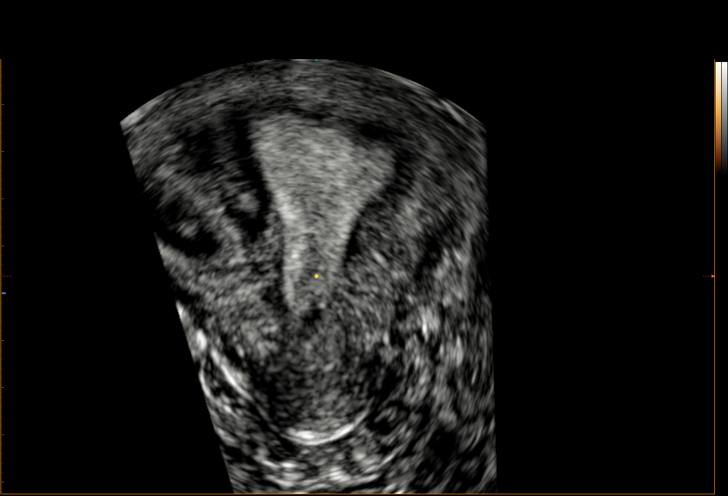

[15 of 25 positions shown; findings below may reference images not displayed]

FINDINGS: Uterus

Measurements: 9.7 x 5.3 x 6.0 cm. The uterus is anteverted and
anteflexed and mildly enlarged. There are uterine fibroids as
follows:

- posterior upper uterine body 1.2 x 1.2 x 1.6 cm fibroid with an
approximately 30% submucosal component

- right anterior upper uterine body 3.2 x 2.9 x 2.7 cm intramural
fibroid

- posterior upper uterine body subserosal 1.4 x 1.0 x 1.3 cm fibroid

Endometrium

The ablated endometrium measures 14 mm in thickness. No endometrial
cavity fluid or focal endometrial mass.

Right ovary

Measurements: 2.6 x 2.0 x 1.5 cm. Normal appearance/no adnexal mass.

Left ovary

Measurements: 1.3 x 2.6 x 2.4 cm. Normal appearance/no adnexal mass.

Other findings

No abnormal free fluid.
IMPRESSION: 1. Anteverted uterus is mildly enlarged by multiple fibroids as
described. A small posterior upper uterine body fibroid demonstrates
a small submucosal component.
2. Mildly thickened (14 mm) ablated endometrium, with no endometrial
cavity fluid and no focal endometrial mass detected.
3. Normal ovaries.  No suspicious adnexal findings.

## 2018-06-02 ENCOUNTER — Other Ambulatory Visit (HOSPITAL_COMMUNITY): Payer: Self-pay | Admitting: Obstetrics and Gynecology

## 2018-06-02 NOTE — Telephone Encounter (Signed)
Can take OTC Motrin as needed.

## 2018-06-02 NOTE — Telephone Encounter (Signed)
Can take OTC Motrin

## 2018-06-02 NOTE — Telephone Encounter (Signed)
Please review for refill.  

## 2018-07-06 IMAGING — US US PELVIS COMPLETE TRANSABD/TRANSVAG
1 series · 13 of 25 positions shown · non-contrast
Comparison: None

CLINICAL DATA: Initial evaluation for abnormal uterine bleeding.



[Series 1: us pelvis complete transabd/transvag · 0.26mm/px · 13 of 44 slices shown]
[im 1/44]
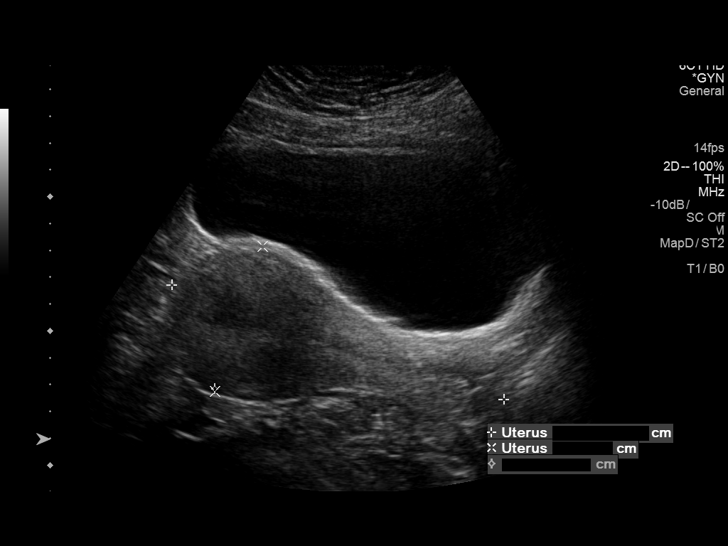
[im 4/44]
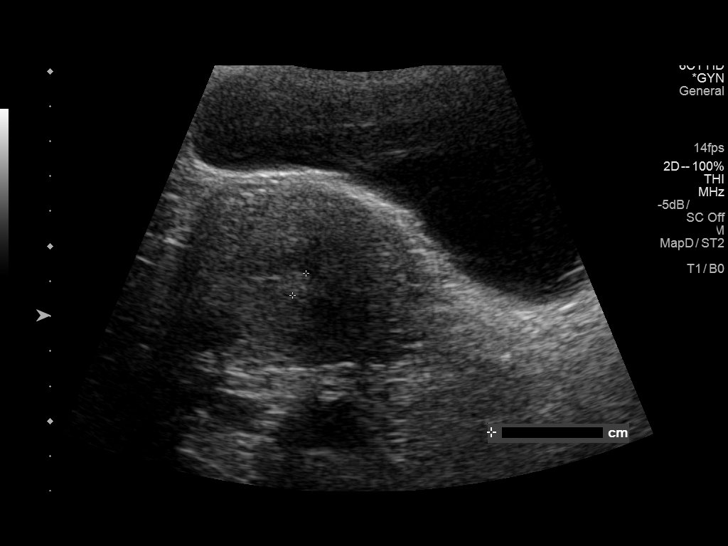
[im 8/44]
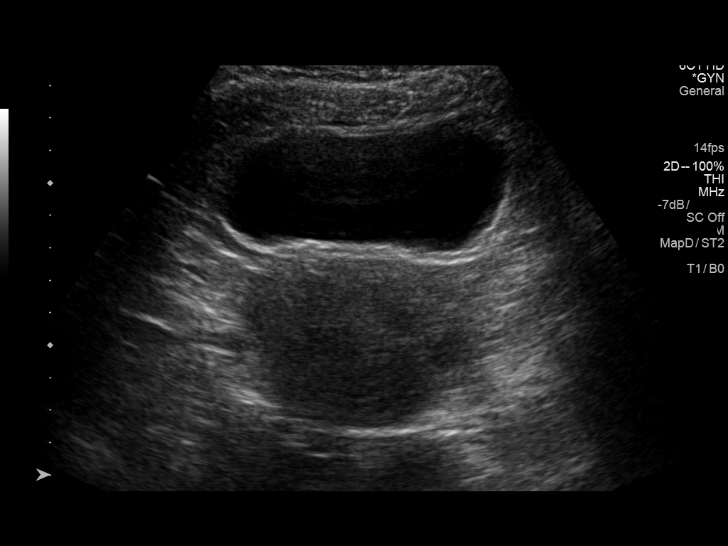
[im 11/44]
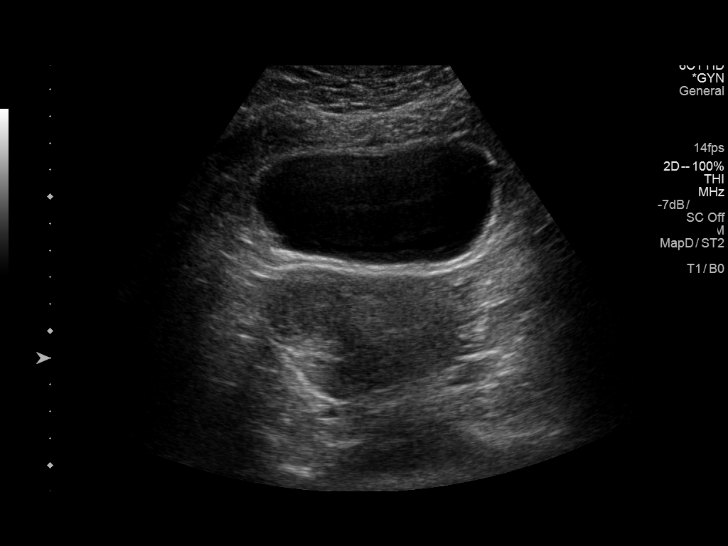
[im 15/44]
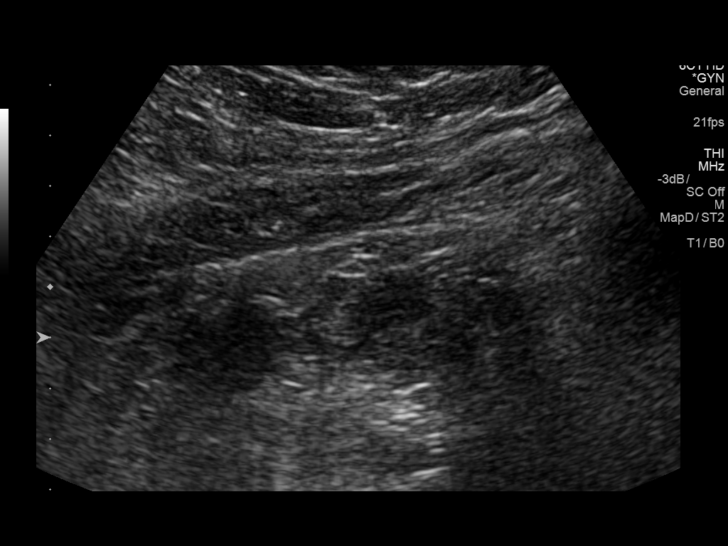
[im 18/44]
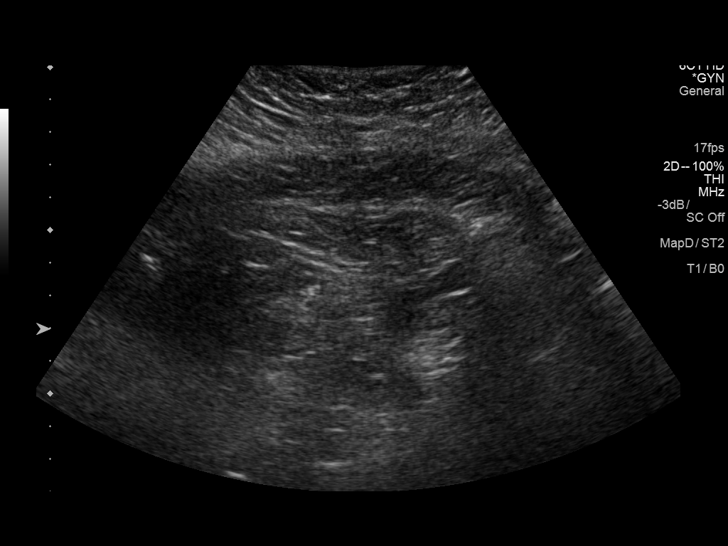
[im 22/44]
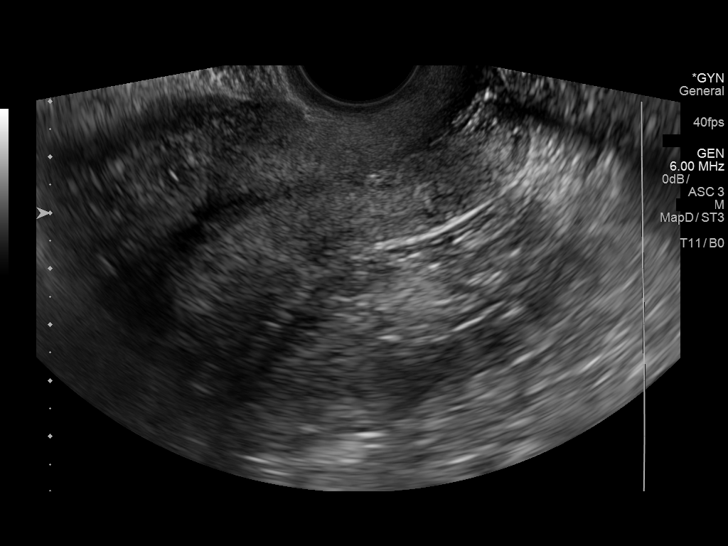
[im 26/44]
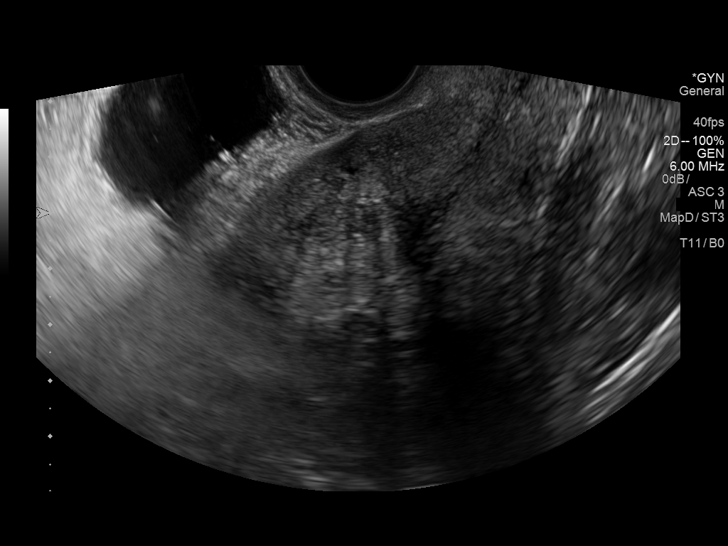
[im 29/44]
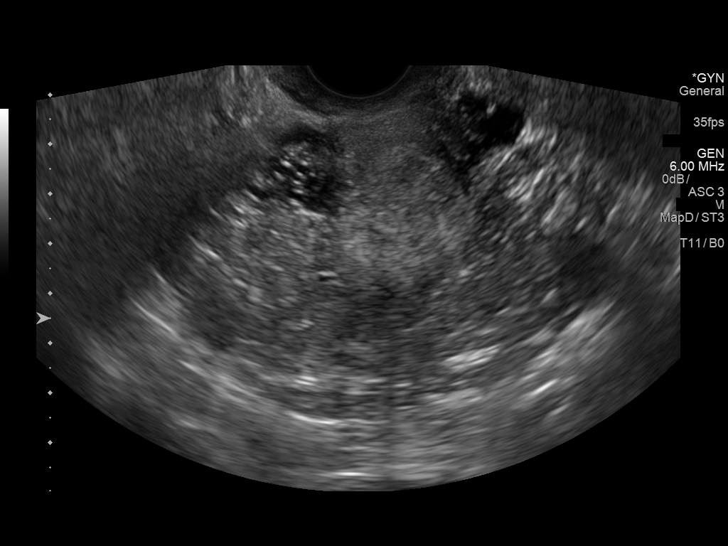
[im 33/44]
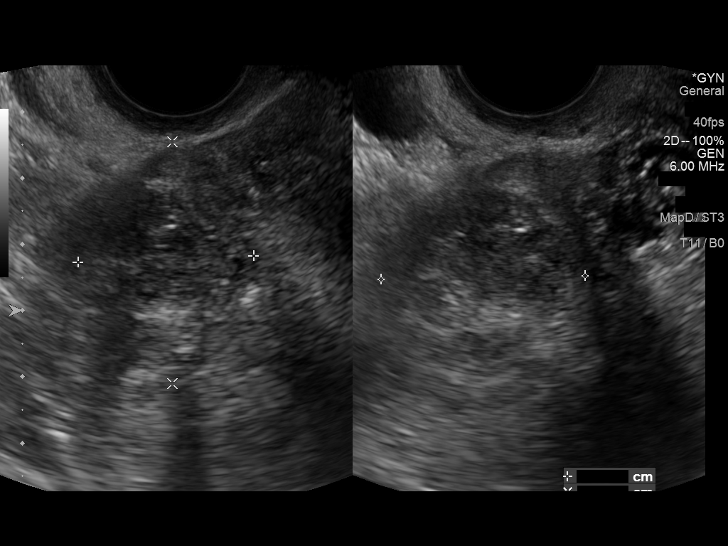
[im 36/44]
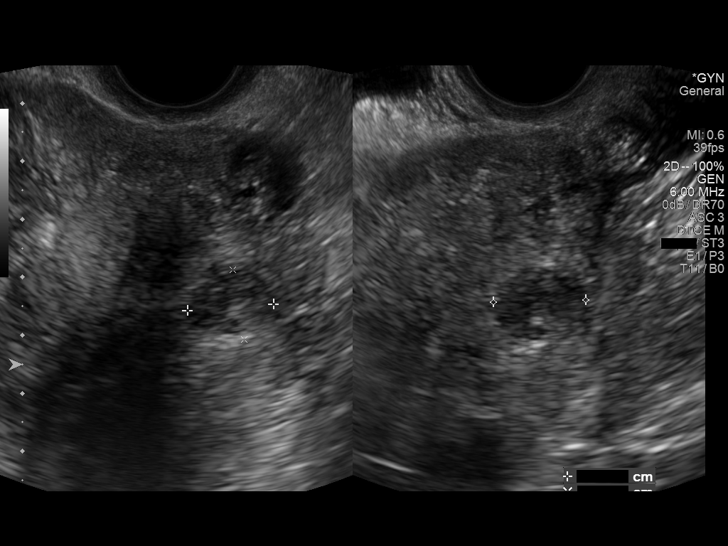
[im 40/44]
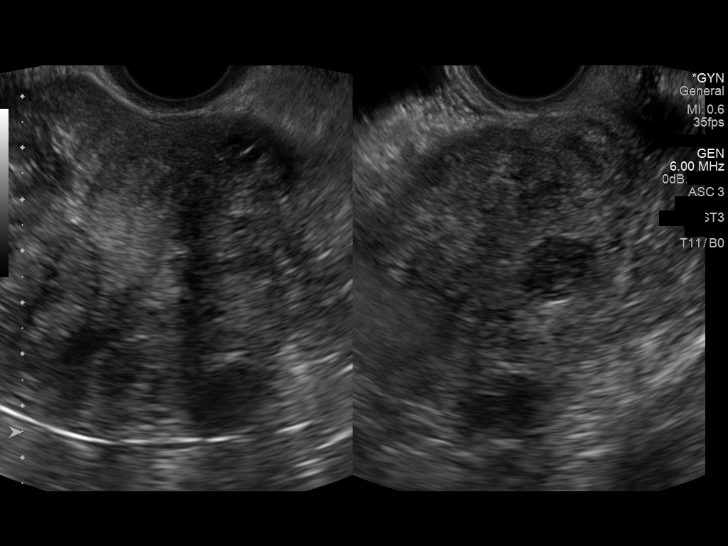
[im 44/44]
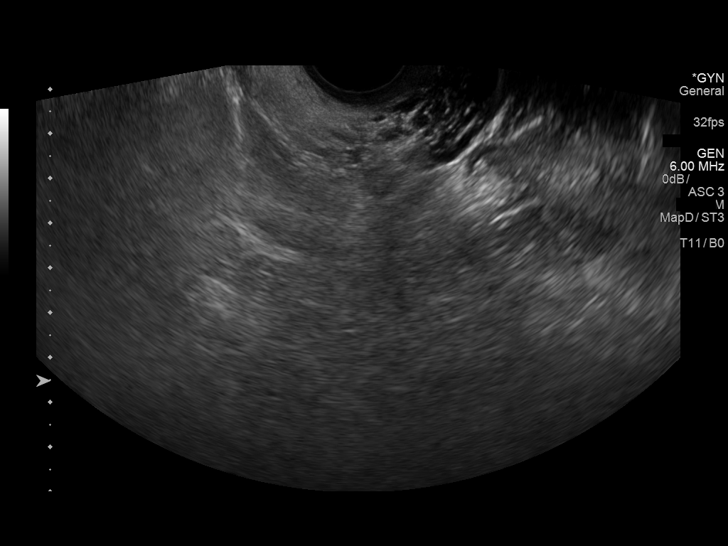

[13 of 25 positions shown; findings below may reference images not displayed]

FINDINGS: Uterus

Measurements: 13.1 x 5.7 x 7.2 cm. Multiple uterine fibroids are
visualized. 4.4 x 3.5 x 4.5 cm intramural fibroid present at the
right uterine fundus. 2.7 x 3.7 x 3.1 cm intramural fibroid at the
adjacent right fundus. 1.5 x 1.2 x 1.6 cm intramural fibroid at the
left anterior uterine body. 1.5 x 1.5 x 2.0 cm intramural fibroid at
the left posterior uterine body.

Endometrium

Thickness: 4.7 mm.  No focal abnormality visualized.

Right ovary

Not visualized.  No adnexal mass.

Left ovary

Measurements: 2.3 x 1.9 x 1.8 cm. Left ovary positioned high within
the left adnexa. Normal appearance/no adnexal mass.

Other findings

No abnormal free fluid.
IMPRESSION: 1. Endometrial stripe measures 4.7 mm. If bleeding remains
unresponsive to hormonal or medical therapy, sonohysterogram should
be considered for focal lesion work-up. (Ref: Radiological
Reasoning: Algorithmic Workup of Abnormal Vaginal Bleeding with
Endovaginal Sonography and Sonohysterography. AJR [id number]; 191:[id number]).
2. Fibroid uterus as above.
3. Nonvisualization of the right ovary. No other adnexal or pelvic
mass identified.
4. Normal left ovary.

## 2019-03-01 ENCOUNTER — Other Ambulatory Visit: Payer: Self-pay | Admitting: Physician Assistant

## 2019-03-01 DIAGNOSIS — Z1231 Encounter for screening mammogram for malignant neoplasm of breast: Secondary | ICD-10-CM

## 2019-07-13 ENCOUNTER — Ambulatory Visit: Payer: Managed Care, Other (non HMO) | Attending: Internal Medicine

## 2019-07-13 DIAGNOSIS — Z20822 Contact with and (suspected) exposure to covid-19: Secondary | ICD-10-CM | POA: Insufficient documentation

## 2019-07-14 LAB — NOVEL CORONAVIRUS, NAA: SARS-CoV-2, NAA: NOT DETECTED

## 2019-08-18 ENCOUNTER — Other Ambulatory Visit: Payer: Self-pay | Admitting: Obstetrics

## 2020-01-01 ENCOUNTER — Other Ambulatory Visit: Payer: Self-pay | Admitting: Obstetrics

## 2022-11-21 ENCOUNTER — Emergency Department (HOSPITAL_COMMUNITY)
Admission: EM | Admit: 2022-11-21 | Discharge: 2022-11-22 | Disposition: A | Discharge status: Home / Self Care | Payer: Managed Care, Other (non HMO) | Attending: Emergency Medicine | Admitting: Emergency Medicine

## 2022-11-21 ENCOUNTER — Encounter (HOSPITAL_COMMUNITY): Payer: Self-pay

## 2022-11-21 DIAGNOSIS — T7840XA Allergy, unspecified, initial encounter: Secondary | ICD-10-CM | POA: Diagnosis present

## 2022-11-21 DIAGNOSIS — I10 Essential (primary) hypertension: Secondary | ICD-10-CM | POA: Insufficient documentation

## 2022-11-21 DIAGNOSIS — T782XXA Anaphylactic shock, unspecified, initial encounter: Secondary | ICD-10-CM | POA: Diagnosis not present

## 2022-11-21 DIAGNOSIS — J45909 Unspecified asthma, uncomplicated: Secondary | ICD-10-CM | POA: Diagnosis not present

## 2022-11-21 MED ORDER — EPINEPHRINE 0.3 MG/0.3ML IJ SOAJ: 0.3000 mg | INTRAMUSCULAR | 0 refills | Status: DC | PRN

## 2022-11-21 MED ORDER — FAMOTIDINE IN NACL 20-0.9 MG/50ML-% IV SOLN
20.0000 mg | Freq: Once | INTRAVENOUS | Status: AC
  Administered 2022-11-21: 20 mg via INTRAVENOUS
  Filled 2022-11-21: qty 50

## 2022-11-21 NOTE — ED Triage Notes (Signed)
Pt is coming in by medic for a alleged allergic reaction, Unknown of what called the allergic reaction, the only new substance she was exposed to today was a new wax used today on her for her waxing. Pt called with SOB, hives, wheezing.   FD gave her a full milligram of epi, also received benadryl, 125mg  solumedrol, albuterol/atrovent neb treatment.   20g left hand   Medic Vitals  183/100 120hr 100% 22rr
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
## 2022-11-21 NOTE — ED Provider Notes (Signed)
Care of patient handed off to my by Marta Lamas, MD at change of shift.  Briefly, 52 year old female who presents via EMS for allergic reaction. She was given IM epi, Solu-medrol, Benadryl, DuoNeb by EMS with significant improvement in symptoms.  Physical Exam  BP (!) 147/120   Pulse (!) 105   Temp 98.8 F (37.1 C)   Resp 20   SpO2 99%   Physical Exam Vitals and nursing note reviewed.  HENT:     Head: Normocephalic and atraumatic.  Eyes:     General: No scleral icterus. Pulmonary:     Effort: Pulmonary effort is normal. No respiratory distress.  Skin:    Findings: No rash.  Neurological:     General: No focal deficit present.     Mental Status: She is alert.  Psychiatric:        Mood and Affect: Mood normal.        Behavior: Behavior normal.        Thought Content: Thought content normal.        Judgment: Judgment normal.     Procedures  Procedures  ED Course / MDM    Medical Decision Making Risk Prescription drug management.   After presentation, to ED had no rash, respiratory distress or angioedema. She was given Pepcid. She did have report of hives and concern for anaphylactic reaction.  She was kept for observation in the ED for 4 hours.   0030: observation period complete. I have re-evaluated the patient. No recurrent anaphylactic symptoms at this time. Will discharge home. She has epi-pen prescription sent. Can use benadryl at home as needed. Strict return precautions were given.   The patient has been appropriately medically screened and/or stabilized in the ED. I have low suspicion for any other emergent medical condition which would require further screening, evaluation or treatment in the ED or require inpatient management. At time of discharge the patient is hemodynamically stable and in no acute distress. I have discussed work-up results and diagnosis with patient and answered all questions. Patient is agreeable with discharge plan. We discussed strict  return precautions for returning to the emergency department and they verbalized understanding.             Cristopher Peru, PA-C 11/22/22 0042    Gilda Crease, MD 11/22/22 [phone]

## 2022-11-21 NOTE — Discharge Instructions (Addendum)
Lum Babe:  Thank you for allowing [place] to take care of you today.  We hope you begin feeling better soon.  To-Do: Please follow-up with your primary doctor. Please return to the Emergency Department or call 911 if you experience chest pain, shortness of breath, severe pain, severe fever, altered mental status, or have any reason to think that you need emergency medical care.  Thank you again.  Hope you feel better soon.  Department of Emergency Medicine [facility]

## 2022-11-21 NOTE — ED Triage Notes (Signed)
Update medic calld FD and they said they gave .3mg  of epi

## 2022-11-22 MED ORDER — ONDANSETRON 4 MG PO TBDP
4.0000 mg | ORAL_TABLET | Freq: Once | ORAL | Status: AC
  Administered 2022-11-22: 4 mg via ORAL
  Filled 2022-11-22: qty 1
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
## 2023-01-20 ENCOUNTER — Other Ambulatory Visit: Payer: Self-pay

## 2023-01-20 ENCOUNTER — Ambulatory Visit (INDEPENDENT_AMBULATORY_CARE_PROVIDER_SITE_OTHER): Payer: Managed Care, Other (non HMO) | Admitting: Allergy

## 2023-01-20 ENCOUNTER — Encounter: Payer: Self-pay | Admitting: Allergy

## 2023-01-20 VITALS — BP 120/70 | HR 62 | Temp 98.0°F | Resp 14 | Ht 64.0 in | Wt 220.2 lb

## 2023-01-20 DIAGNOSIS — J3089 Other allergic rhinitis: Secondary | ICD-10-CM

## 2023-01-20 DIAGNOSIS — J4520 Mild intermittent asthma, uncomplicated: Secondary | ICD-10-CM | POA: Diagnosis not present

## 2023-01-20 DIAGNOSIS — T7840XD Allergy, unspecified, subsequent encounter: Secondary | ICD-10-CM | POA: Diagnosis not present

## 2023-01-20 MED ORDER — EPINEPHRINE 0.3 MG/0.3ML IJ SOAJ: 0.3000 mg | INTRAMUSCULAR | 1 refills | Status: AC | PRN

## 2023-01-20 NOTE — Patient Instructions (Addendum)
Today's skin testing:  Negative to indoor/outdoor allergens.  Results given.  Allergic reaction Do not use the waxing material you had a reaction to. See if you get an ingredient list of the wax that you had a reaction to and also to the one that you didn't have a reaction to. Recommend to wash off waxing area after finished waxing at home. Keep track of episodes.  If you have more episodes we will get bloodwork drawn next.    I have prescribed epinephrine injectable device and demonstrated proper use. For mild symptoms you can take over the counter antihistamines such as Benadryl 1-2 tablets = 25-50mg  and monitor symptoms closely. If symptoms worsen or if you have severe symptoms including breathing issues, throat closure, significant swelling, whole body hives, severe diarrhea and vomiting, lightheadedness then inject epinephrine and seek immediate medical care afterwards. Emergency action plan given.  Environmental allergies Start environmental control measures as below - suspicious for pollen allergy. Use over the counter antihistamines such as Zyrtec (cetirizine), Claritin (loratadine), Allegra (fexofenadine), or Xyzal (levocetirizine) daily as needed. May take twice a day during allergy flares. May switch antihistamines every few months.  Asthma May use albuterol rescue inhaler 2 puffs every 4 to 6 hours as needed for shortness of breath, chest tightness, coughing, and wheezing.  Monitor frequency of use - if you need to use it more than twice per week on a consistent basis let us know.   Return in about 6 months (around 07/23/2023). Or sooner if needed.   Reducing Pollen Exposure Pollen seasons: trees (spring), grass (summer) and ragweed/weeds (fall). Keep windows closed in your home and car to lower pollen exposure.  Install air conditioning in the bedroom and throughout the house if possible.  Avoid going out in dry windy days - especially early morning. Pollen counts are  highest between 5 - 10 AM and on dry, hot and windy days.  Save outside activities for late afternoon or after a heavy rain, when pollen levels are lower.  Avoid mowing of grass if you have grass pollen allergy. Be aware that pollen can also be transported indoors on people and pets.  Dry your clothes in an automatic dryer rather than hanging them outside where they might collect pollen.  Rinse hair and eyes before bedtime.

## 2023-07-23 ENCOUNTER — Ambulatory Visit: Payer: Self-pay | Admitting: Allergy

## 2023-07-23 DIAGNOSIS — J309 Allergic rhinitis, unspecified: Secondary | ICD-10-CM

## 2023-07-23 NOTE — Progress Notes (Deleted)
 Follow Up Note  RE: OMEGA DURANTE MRN: [id number] DOB: 09-Nov-1970 Date of Office Visit: 07/23/2023  Referring provider: No ref. provider found Primary care provider: Porfirio Oar, PA  Chief Complaint: No chief complaint on file.  History of Present Illness: I had the pleasure of seeing Jane Snyder for a follow up visit at the [facility] on 07/23/2023. She is a 53 y.o. female, who is being followed for allergic reactions, asthma, allergic rhinitis. Her previous allergy office visit was on 01/20/2023 with Dr. Selena Batten. Today is a regular follow up visit.  Discussed the use of AI scribe software for clinical note transcription with the patient, who gave verbal consent to proceed.  History of Present Illness            ***  Assessment and Plan: Jane Snyder is a 53 y.o. female with: Allergic reaction, subsequent encounter Allergic reaction to new waxing material requiring IM epi and ER visit. Subsequently had another waxing session with no issues but used a different wax. No prior h/o allergic reactions. Denies any other changes in diet, meds. She was not around fresh cut grass or outdoors a lot.  Do not use the waxing material you had a reaction to. We don't have skin testing available for chemical products.  See if you get an ingredient list of the wax that you had a reaction to and also to the one that you didn't have a reaction to. Recommend to wash off waxing area after finished waxing at home. Keep track of episodes.  If you have more episodes will get bloodwork drawn next.   I have prescribed epinephrine injectable device and demonstrated proper use. For mild symptoms you can take over the counter antihistamines such as Benadryl 1-2 tablets = 25-50mg  and monitor symptoms closely. If symptoms worsen or if you have severe symptoms including breathing issues, throat closure, significant swelling, whole body hives, severe diarrhea and vomiting,  lightheadedness then inject epinephrine and seek immediate medical care afterwards. Emergency action plan given.   Mild intermittent asthma without complication Usually flares in the spring and summer and uses albuterol about twice per month. Today's spirometry was unremarkable.  May use albuterol rescue inhaler 2 puffs every 4 to 6 hours as needed for shortness of breath, chest tightness, coughing, and wheezing.  Monitor frequency of use - if you need to use it more than twice per week on a consistent basis let us know.    Other allergic rhinitis Some symptoms in the spring and summer. Today's skin prick testing negative to indoor/outdoor allergens. May use albuterol rescue inhaler 2 puffs every 4 to 6 hours as needed for shortness of breath, chest tightness, coughing, and wheezing.  Monitor frequency of use - if you need to use it more than twice per week on a consistent basis let us know.  Assessment and Plan              No follow-ups on file.  No orders of the defined types were placed in this encounter.  Lab Orders  No laboratory test(s) ordered today    Diagnostics: Spirometry:  Tracings reviewed. Her effort: {Blank single:19197::"Good reproducible efforts.","It was hard to get consistent efforts and there is a question as to whether this reflects a maximal maneuver.","Poor effort, data can not be interpreted."} FVC: ***L FEV1: ***L, ***% predicted FEV1/FVC ratio: ***% Interpretation: {Blank single:19197::"Spirometry consistent with mild obstructive disease","Spirometry consistent with moderate obstructive disease","Spirometry consistent with severe obstructive disease","Spirometry consistent with  possible restrictive disease","Spirometry consistent with mixed obstructive and restrictive disease","Spirometry uninterpretable due to technique","Spirometry consistent with normal pattern","No overt abnormalities noted given today's efforts"}.  Please see scanned spirometry  results for details.  Skin Testing: {Blank single:19197::"Select foods","Environmental allergy panel","Environmental allergy panel and select foods","Food allergy panel","None","Deferred due to recent antihistamines use"}. *** Results discussed with patient/family.   Medication List:  Current Outpatient Medications  Medication Sig Dispense Refill  . albuterol (PROVENTIL HFA;VENTOLIN HFA) 108 (90 Base) MCG/ACT inhaler Inhale 1-2 puffs into the lungs every 6 (six) hours as needed for wheezing or shortness of breath. 1 Inhaler 2  . amLODipine (NORVASC) 10 MG tablet Take 1 tablet (10 mg total) by mouth daily. 90 tablet 3  . EPINEPHrine 0.3 mg/0.3 mL IJ SOAJ injection Inject 0.3 mg into the muscle as needed for anaphylaxis. 2 each 1  . fluticasone (FLONASE) 50 MCG/ACT nasal spray Place 2 sprays into both nostrils daily. (Patient taking differently: Place 2 sprays into both nostrils daily as needed (for allergies.).) 16 g prn  . ibuprofen (ADVIL) 800 MG tablet TAKE 1 TABLET BY MOUTH EVERY 8 HOURS AS NEEDED 30 tablet 1   No current facility-administered medications for this visit.   Allergies: No Known Allergies I reviewed her past medical history, social history, family history, and environmental history and no significant changes have been reported from her previous visit.  Review of Systems  Constitutional:  Negative for appetite change, chills, fever and unexpected weight change.  HENT:  Negative for congestion and rhinorrhea.   Eyes:  Negative for itching.  Respiratory:  Negative for cough, chest tightness, shortness of breath and wheezing.   Cardiovascular:  Negative for chest pain.  Gastrointestinal:  Negative for abdominal pain.  Genitourinary:  Negative for difficulty urinating.  Skin:  Negative for rash.  Neurological:  Negative for headaches.   Objective: There were no vitals taken for this visit. There is no height or weight on file to calculate BMI. Physical Exam Vitals and  nursing note reviewed.  Constitutional:      Appearance: Normal appearance. She is well-developed.  HENT:     Head: Normocephalic and atraumatic.     Right Ear: Tympanic membrane and external ear normal.     Left Ear: Tympanic membrane and external ear normal.     Nose: Nose normal.     Mouth/Throat:     Mouth: Mucous membranes are moist.     Pharynx: Oropharynx is clear.  Eyes:     Conjunctiva/sclera: Conjunctivae normal.  Cardiovascular:     Rate and Rhythm: Normal rate and regular rhythm.     Heart sounds: Normal heart sounds. No murmur heard.    No friction rub. No gallop.  Pulmonary:     Effort: Pulmonary effort is normal.     Breath sounds: Normal breath sounds. No wheezing, rhonchi or rales.  Musculoskeletal:     Cervical back: Neck supple.  Skin:    General: Skin is warm.     Findings: No rash.  Neurological:     Mental Status: She is alert and oriented to person, place, and time.  Psychiatric:        Behavior: Behavior normal.  Previous notes and tests were reviewed. The plan was reviewed with the patient/family, and all questions/concerned were addressed.  It was my pleasure to see Anyia today and participate in her care. Please feel free to contact me with any questions or concerns.  Sincerely,  Wyline Mood, DO Allergy & Immunology  [facility] office: [phone] [place] office: [phone]

## 2023-07-28 ENCOUNTER — Encounter (HOSPITAL_COMMUNITY): Payer: Self-pay | Admitting: Emergency Medicine

## 2023-07-28 ENCOUNTER — Emergency Department (HOSPITAL_COMMUNITY)
Admission: EM | Admit: 2023-07-28 | Discharge: 2023-07-28 | Disposition: A | Discharge status: Home / Self Care | Payer: Self-pay | Attending: Emergency Medicine | Admitting: Emergency Medicine

## 2023-07-28 ENCOUNTER — Emergency Department (HOSPITAL_COMMUNITY): Payer: Self-pay

## 2023-07-28 DIAGNOSIS — R519 Headache, unspecified: Secondary | ICD-10-CM | POA: Insufficient documentation

## 2023-07-28 DIAGNOSIS — I10 Essential (primary) hypertension: Secondary | ICD-10-CM | POA: Insufficient documentation

## 2023-07-28 DIAGNOSIS — Z79899 Other long term (current) drug therapy: Secondary | ICD-10-CM | POA: Insufficient documentation

## 2023-07-28 LAB — CBC
HCT: 42.4 % (ref 36.0–46.0)
Hemoglobin: 14 g/dL (ref 12.0–15.0)
MCH: 28.2 pg (ref 26.0–34.0)
MCHC: 33 g/dL (ref 30.0–36.0)
MCV: 85.5 fL (ref 80.0–100.0)
Platelets: 347 10*3/uL (ref 150–400)
RBC: 4.96 MIL/uL (ref 3.87–5.11)
RDW: 13.3 % (ref 11.5–15.5)
WBC: 12.5 10*3/uL — ABNORMAL HIGH (ref 4.0–10.5)
nRBC: 0 % (ref 0.0–0.2)

## 2023-07-28 LAB — COMPREHENSIVE METABOLIC PANEL
ALT: 12 U/L (ref 0–44)
AST: 17 U/L (ref 15–41)
Albumin: 3 g/dL — ABNORMAL LOW (ref 3.5–5.0)
Alkaline Phosphatase: 59 U/L (ref 38–126)
Anion gap: 12 (ref 5–15)
BUN: 8 mg/dL (ref 6–20)
CO2: 23 mmol/L (ref 22–32)
Calcium: 8.8 mg/dL — ABNORMAL LOW (ref 8.9–10.3)
Chloride: 101 mmol/L (ref 98–111)
Creatinine, Ser: 0.76 mg/dL (ref 0.44–1.00)
GFR, Estimated: >60 mL/min (ref >60)
Glucose, Bld: 107 mg/dL — ABNORMAL HIGH (ref 70–99)
Potassium: 3.6 mmol/L (ref 3.5–5.1)
Sodium: 136 mmol/L (ref 135–145)
Total Bilirubin: 0.3 mg/dL (ref 0.0–1.2)
Total Protein: 7.2 g/dL (ref 6.5–8.1)

## 2023-07-28 MED ORDER — METOCLOPRAMIDE HCL 5 MG/ML IJ SOLN
10.0000 mg | Freq: Once | INTRAMUSCULAR | Status: AC
  Administered 2023-07-28: 10 mg via INTRAVENOUS
  Filled 2023-07-28: qty 2

## 2023-07-28 MED ORDER — DIPHENHYDRAMINE HCL 50 MG/ML IJ SOLN
25.0000 mg | Freq: Once | INTRAMUSCULAR | Status: AC
  Administered 2023-07-28: 25 mg via INTRAVENOUS
  Filled 2023-07-28: qty 1

## 2023-07-28 MED ORDER — LACTATED RINGERS IV BOLUS
1000.0000 mL | Freq: Once | INTRAVENOUS | Status: AC
  Administered 2023-07-28: 1000 mL via INTRAVENOUS

## 2023-07-28 MED ORDER — DEXAMETHASONE SODIUM PHOSPHATE 10 MG/ML IJ SOLN
10.0000 mg | Freq: Once | INTRAMUSCULAR | Status: AC
  Administered 2023-07-28: 10 mg via INTRAVENOUS
  Filled 2023-07-28: qty 1

## 2023-07-28 NOTE — ED Triage Notes (Signed)
 Pt here from home with c/op h/a that has been ongoing for a couple of days , light sensitivity and b/p is elevated despite taking her meds

## 2023-07-29 NOTE — ED Provider Notes (Signed)
 [place] [facility] AT [facility] Provider Note   CSN: [id number] Arrival date & time: 07/28/23  0034     History  Chief Complaint  Patient presents with   Migraine   Hypertension    Jane Snyder is a 53 y.o. female.  53 year old female with a history of migraines but none that last this long.  She has a headache for the last few days.  Similar previous migraines once again lasting a lot longer.  No fever, pain.  Does have some photophobia and also has nausea with her headache.  No neurologic changes otherwise.  Right over-the-counter medications at home without improvement.   Migraine  Hypertension       Home Medications Prior to Admission medications   Medication Sig Start Date End Date Taking? Authorizing Provider  albuterol (PROVENTIL HFA;VENTOLIN HFA) 108 (90 Base) MCG/ACT inhaler Inhale 1-2 puffs into the lungs every 6 (six) hours as needed for wheezing or shortness of breath. 11/05/17   Porfirio Oar, PA  amLODipine (NORVASC) 10 MG tablet Take 1 tablet (10 mg total) by mouth daily. 10/28/17   Porfirio Oar, PA  EPINEPHrine 0.3 mg/0.3 mL IJ SOAJ injection Inject 0.3 mg into the muscle as needed for anaphylaxis. 01/20/23   Ellamae Sia, DO  fluticasone (FLONASE) 50 MCG/ACT nasal spray Place 2 sprays into both nostrils daily. Patient taking differently: Place 2 sprays into both nostrils daily as needed (for allergies.). 03/11/17   Porfirio Oar, PA  ibuprofen (ADVIL) 800 MG tablet TAKE 1 TABLET BY MOUTH EVERY 8 HOURS AS NEEDED 01/03/20   Brock Bad, MD      Allergies    Patient has no known allergies.    Review of Systems   Review of Systems  Physical Exam Updated Vital Signs BP 130/73 (BP Location: Right Arm)   Pulse 64   Temp 97.8 F (36.6 C) (Oral)   Resp 16   SpO2 98%  Physical Exam Vitals and nursing note reviewed.  Constitutional:      Appearance: She is well-developed.  HENT:     Head: Normocephalic and  atraumatic.  Cardiovascular:     Rate and Rhythm: Normal rate and regular rhythm.  Pulmonary:     Effort: No respiratory distress.     Breath sounds: No stridor.  Abdominal:     General: There is no distension.  Musculoskeletal:     Cervical back: Normal range of motion.  Neurological:     Mental Status: She is alert.     Comments: No altered mental status, able to give full seemingly accurate history.  Face is symmetric, EOM's intact, pupils equal and reactive, vision intact, tongue and uvula midline without deviation. Upper and Lower extremity motor 5/5, intact pain perception in distal extremities, 2+ reflexes in biceps, patella and achilles tendons. Able to perform finger to nose normal with both hands. Walks without assistance or evident ataxia.       ED Results / Procedures / Treatments   Labs (all labs ordered are listed, but only abnormal results are displayed) Labs Reviewed  CBC - Abnormal; Notable for the following components:      Result Value   WBC 12.5 (*)    All other components within normal limits  COMPREHENSIVE METABOLIC PANEL - Abnormal; Notable for the following components:   Glucose, Bld 107 (*)    Calcium 8.8 (*)    Albumin 3.0 (*)    All other components within normal limits  EKG EKG Interpretation Date/Time:  Monday July 28 2023 00:40:44 EST Ventricular Rate:  69 PR Interval:  124 QRS Duration:  82 QT Interval:  412 QTC Calculation: 441 R Axis:   105  Text Interpretation: Normal sinus rhythm Rightward axis Nonspecific T wave abnormality Abnormal ECG When compared with ECG of 14-Nov-2017 08:49, PREVIOUS ECG IS PRESENT Confirmed by Marily Memos [phone]) on 07/28/2023 3:41:10 AM  Radiology CT Head Wo Contrast Result Date: 07/28/2023 CLINICAL DATA:  Headache with increasing frequency or severity. EXAM: CT HEAD WITHOUT CONTRAST TECHNIQUE: Contiguous axial images were obtained from the base of the skull through the vertex without intravenous contrast.  RADIATION DOSE REDUCTION: This exam was performed according to the departmental dose-optimization program which includes automated exposure control, adjustment of the mA and/or kV according to patient size and/or use of iterative reconstruction technique. COMPARISON:  None Available. FINDINGS: Brain: No evidence of acute infarction, hemorrhage, hydrocephalus, extra-axial collection or mass lesion/mass effect. Vascular: No hyperdense vessel or unexpected calcification. Skull: Normal. Negative for fracture or focal lesion. Sinuses/Orbits: No acute finding. IMPRESSION: Negative head CT.  No explanation for headache. Electronically Signed   By: Tiburcio Pea M.D.   On: 07/28/2023 05:26    Procedures Procedures    Medications Ordered in ED Medications  metoCLOPramide (REGLAN) injection 10 mg (10 mg Intravenous Given 07/28/23 0342)  diphenhydrAMINE (BENADRYL) injection 25 mg (25 mg Intravenous Given 07/28/23 0343)  dexamethasone (DECADRON) injection 10 mg (10 mg Intravenous Given 07/28/23 0341)  lactated ringers bolus 1,000 mL (0 mLs Intravenous Stopped 07/28/23 0605)    ED Course/ Medical Decision Making/ A&P                                 Medical Decision Making Amount and/or Complexity of Data Reviewed Labs: ordered. Radiology: ordered.  Risk Prescription drug management.   Headache.  [place]erry to increased length of symptoms CT was done to rule out any bleed or space-occupying mass and was negative.  Headache improved almost 100% with medications here.  Will follow-up with neurology.  Otherwise stable for discharge.  Final Clinical Impression(s) / ED Diagnoses Final diagnoses:  Bad headache    Rx / DC Orders ED Discharge Orders          Ordered    Ambulatory referral to Neurology       Comments: An appointment is requested in approximately: 8 weeks   07/28/23 0547              Benjimin Hadden, Barbara Cower, MD 07/29/23 [phone]
